# Patient Record
Sex: Female | Born: 1985 | Race: White | Hispanic: No | Marital: Married | Smoking: Current every day smoker
Health system: Southern US, Community
[De-identification: ages and names within clinical notes are randomized; demographics above are authoritative.]

## PROBLEM LIST (undated history)

## (undated) DIAGNOSIS — I10 Essential (primary) hypertension: Secondary | ICD-10-CM

---

## 2007-06-07 ENCOUNTER — Emergency Department: Payer: Self-pay | Admitting: Emergency Medicine

## 2011-10-05 ENCOUNTER — Encounter: Payer: Self-pay | Admitting: Obstetrics and Gynecology

## 2011-12-28 ENCOUNTER — Observation Stay: Payer: Self-pay | Admitting: Obstetrics and Gynecology

## 2011-12-28 LAB — CBC WITH DIFFERENTIAL/PLATELET
Basophil #: 0 10*3/uL (ref 0.0–0.1)
Eosinophil #: 0.2 10*3/uL (ref 0.0–0.7)
Eosinophil %: 1.6 %
HCT: 32.4 % — ABNORMAL LOW (ref 35.0–47.0)
Lymphocyte %: 14.5 %
MCHC: 33.9 g/dL (ref 32.0–36.0)
Monocyte %: 5.4 %
Neutrophil #: 9.2 10*3/uL — ABNORMAL HIGH (ref 1.4–6.5)
Neutrophil %: 78.3 %
Platelet: 154 10*3/uL (ref 150–440)
RBC: 3.39 10*6/uL — ABNORMAL LOW (ref 3.80–5.20)
RDW: 13.4 % (ref 11.5–14.5)
WBC: 11.8 10*3/uL — ABNORMAL HIGH (ref 3.6–11.0)

## 2012-04-03 ENCOUNTER — Inpatient Hospital Stay: Payer: Self-pay | Admitting: Obstetrics and Gynecology

## 2012-04-03 LAB — CBC WITH DIFFERENTIAL/PLATELET
Basophil #: 0 10*3/uL (ref 0.0–0.1)
Basophil %: 0.2 %
HGB: 10.4 g/dL — ABNORMAL LOW (ref 12.0–16.0)
Lymphocyte #: 2 10*3/uL (ref 1.0–3.6)
Lymphocyte %: 15.2 %
MCHC: 34.7 g/dL (ref 32.0–36.0)
MCV: 92 fL (ref 80–100)
Monocyte #: 0.9 x10 3/mm (ref 0.2–0.9)
Neutrophil #: 10.2 10*3/uL — ABNORMAL HIGH (ref 1.4–6.5)
Neutrophil %: 76.3 %
RBC: 3.26 10*6/uL — ABNORMAL LOW (ref 3.80–5.20)
WBC: 13.3 10*3/uL — ABNORMAL HIGH (ref 3.6–11.0)

## 2012-04-03 LAB — PROTEIN / CREATININE RATIO, URINE
Creatinine, Urine: 32.3 mg/dL (ref 30.0–125.0)
Protein/Creat. Ratio: 217 mg/gCREAT — ABNORMAL HIGH (ref 0–200)

## 2012-04-05 LAB — HEMATOCRIT: HCT: 26.2 % — ABNORMAL LOW (ref 35.0–47.0)

## 2013-08-06 IMAGING — US US OB NUCHAL TRANSLUCENCY 1ST GEST - [facility] NRPT
1 series · 14 of 28 positions shown · non-contrast
Comparison: none

[Series 1: us ob nuchal translucency 1st gest - [facility] nrpt · 14 of 44 slices shown]
[im 2/44]
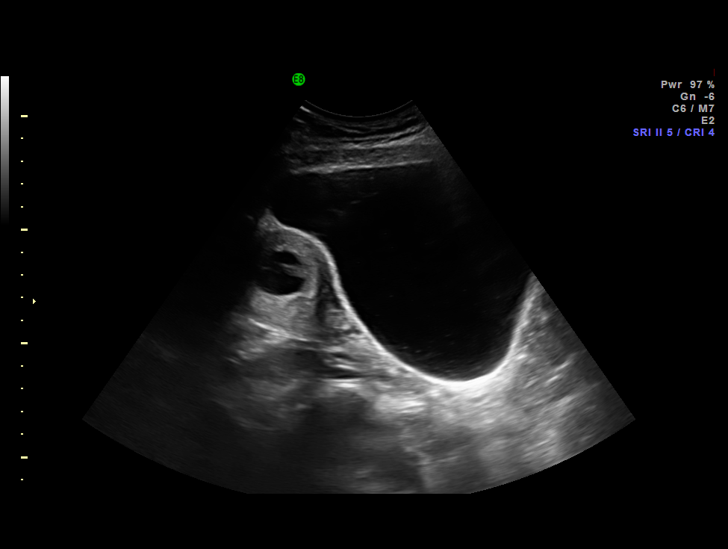
[im 5/44]
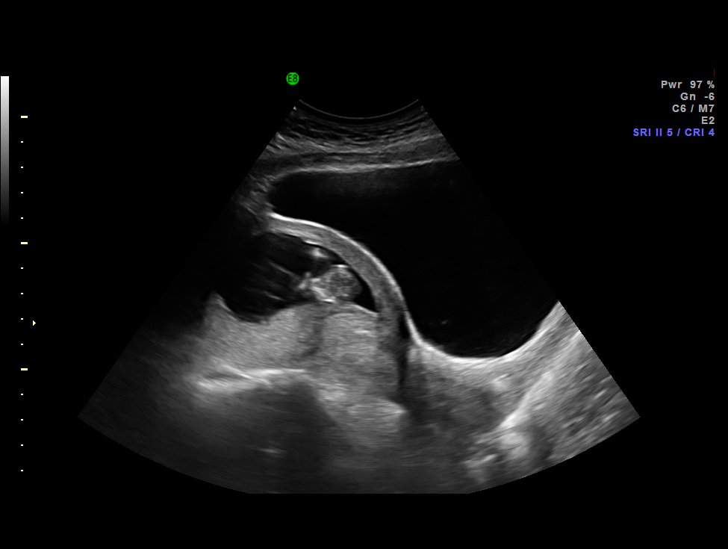
[im 8/44]
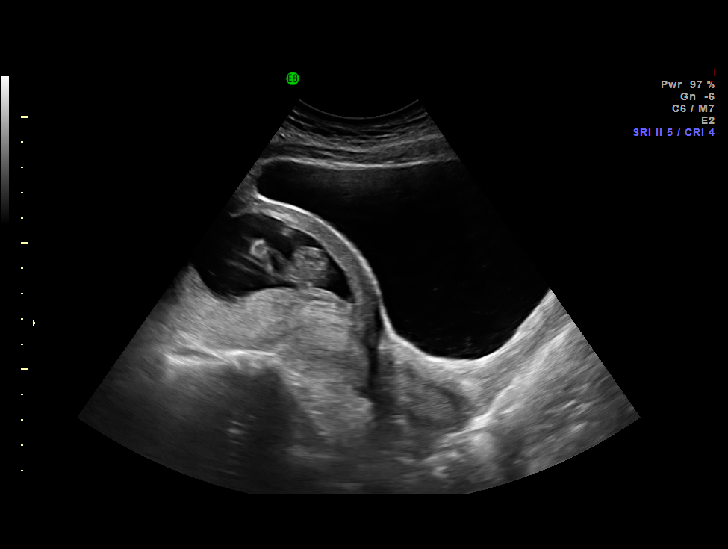
[im 12/44]
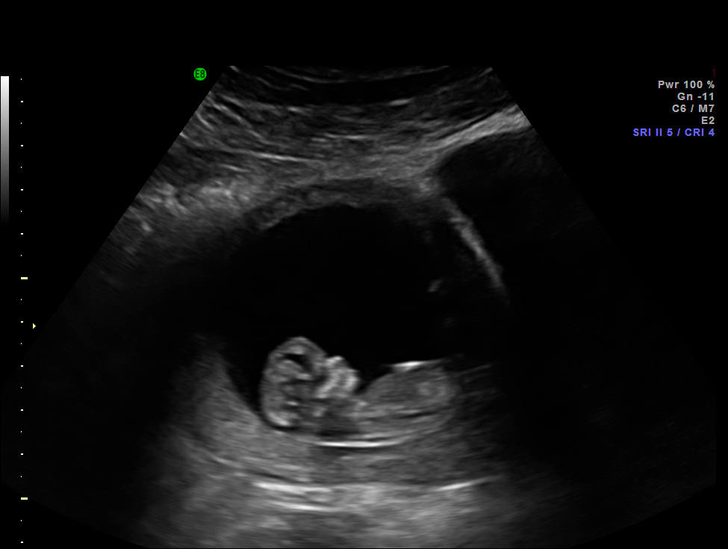
[im 15/44]
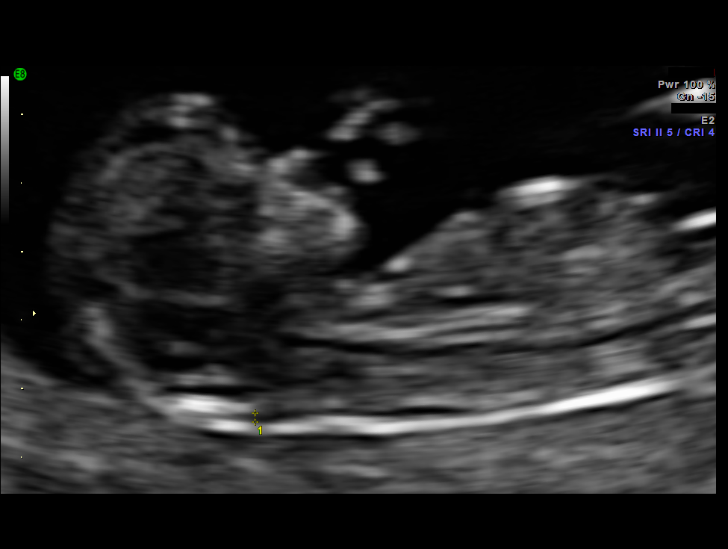
[im 18/44]
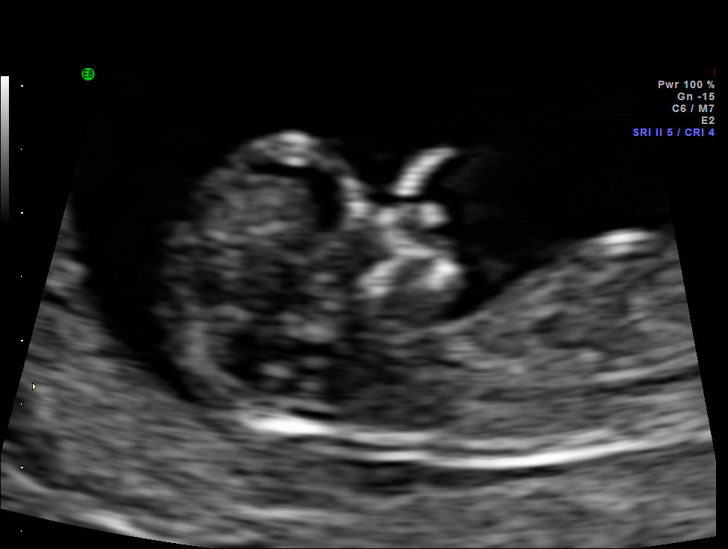
[im 21/44]
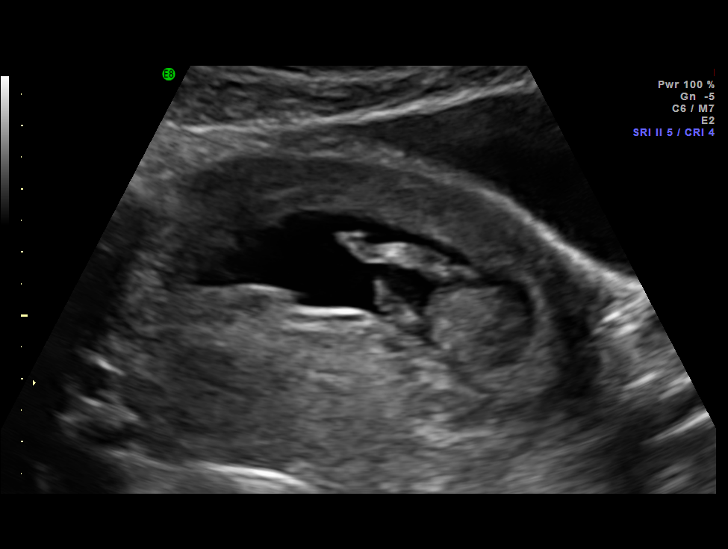
[im 24/44]
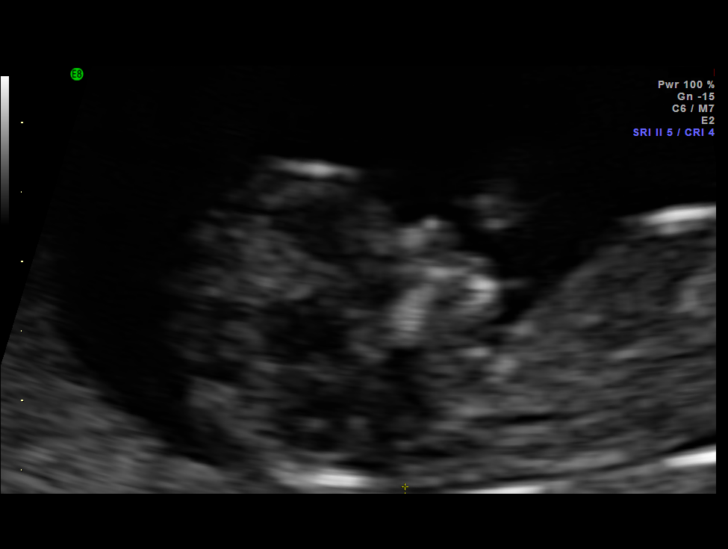
[im 28/44]
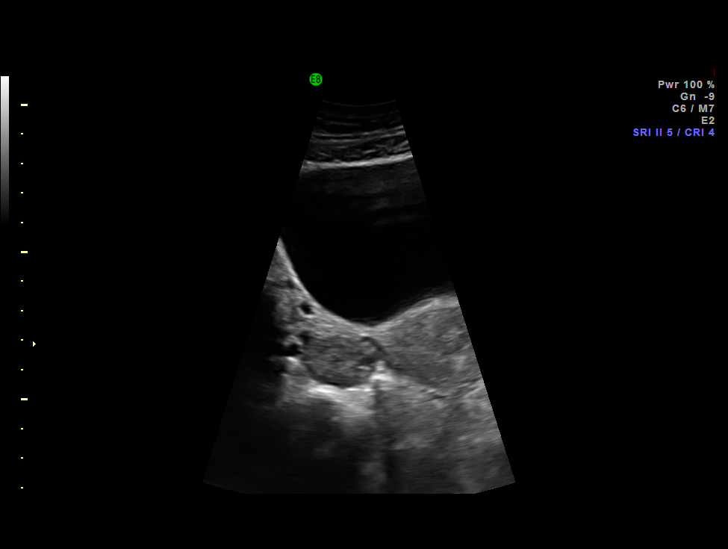
[im 31/44]
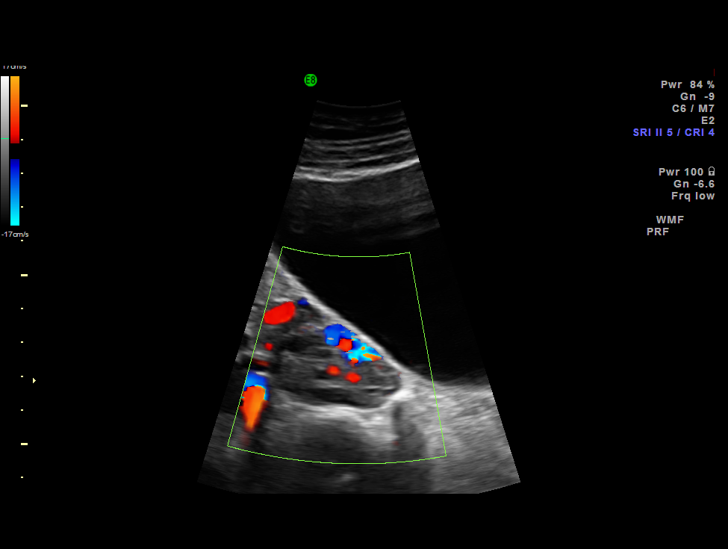
[im 34/44]
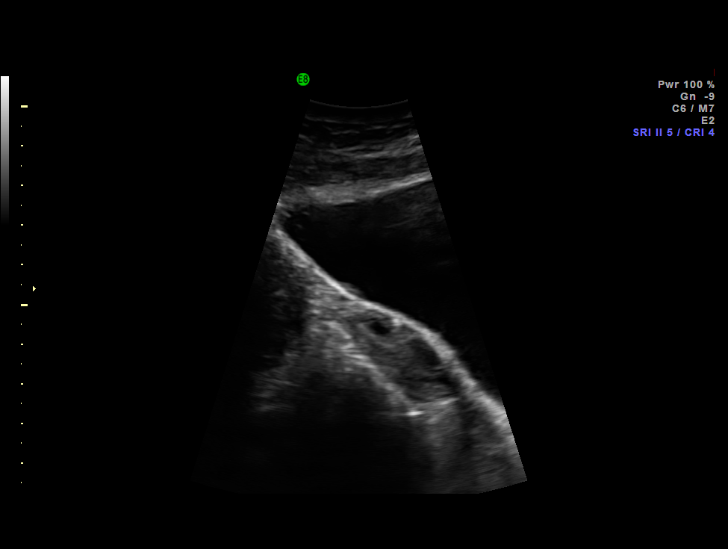
[im 37/44]
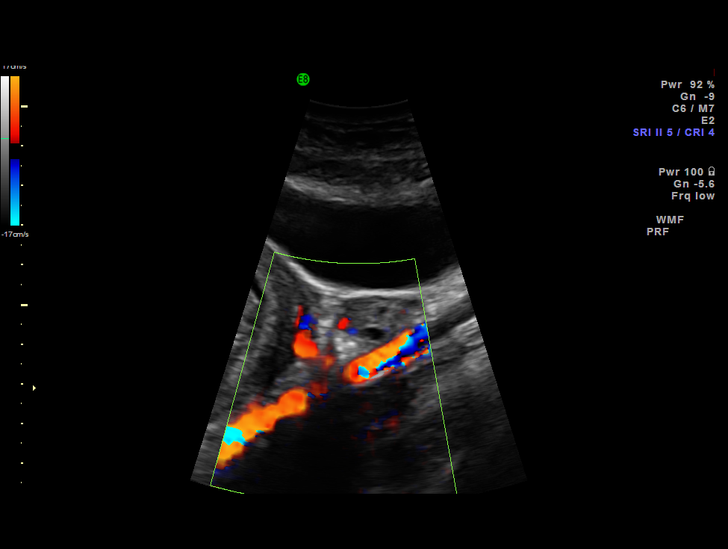
[im 40/44]
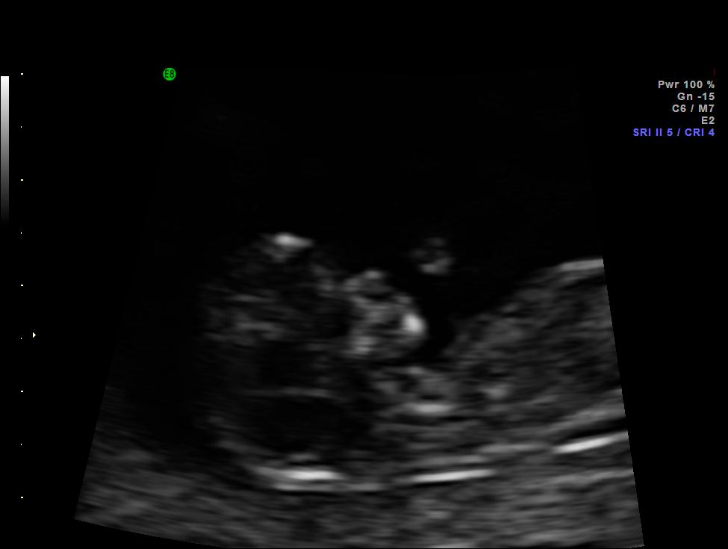
[im 44/44]
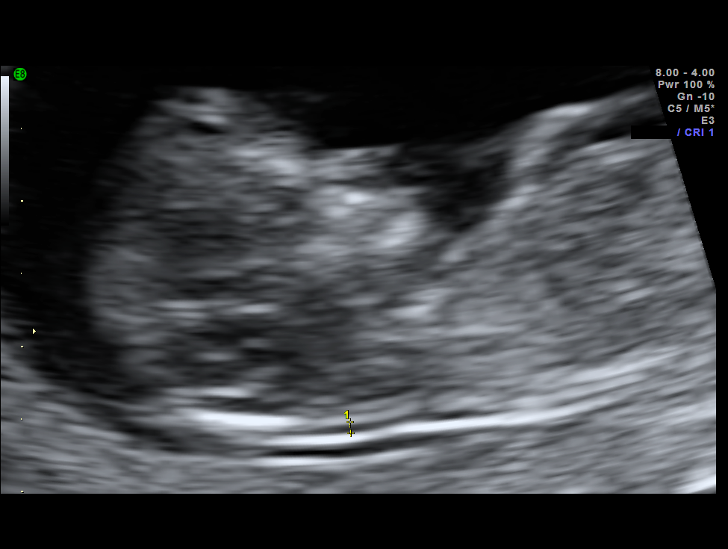

[14 of 28 positions shown; findings below may reference images not displayed]

IMAGES IMPORTED FROM THE [employer] WORKFLOW SYSTEM
NO DICTATION FOR STUDY

## 2015-04-13 NOTE — H&P (Signed)
L&D Evaluation:  History:   HPI 29yo G1P0 with LMP of 07/12/11 & EDD of 04/16/12 here tonight for oIOL at 38 1/7 weeks scheduled by Acquanetta BellingAngela Lugiano, CNM today due to BP elevation in the office. Labs and urine for protein or 24 h urine has not been done so will get labs tonight. Pt denies RUQ pain, visual disturbances, HA. Cx wasa 1/60/vtx -2 with Bishop score of 8. Cervidil planned and pt advised of the risks, benefits and alternatives of IOL and she and partner agree with plan. Dr Feliberto GottronSchermerhorn was advised that pt was 38 1/7 weeks and that her BP initial was 140/93 and now 128/85. He disc that Dr Logan BoresEVans and Dolores PattyAnglea Lugiano, CNM may have disc the plan of care today and set up the IOL. IOL orders written by Acquanetta BellingAngela Lugiano, CNM.    Presents with Trem preg with Gest HTN    Patient's Medical History Anemia, Gest HTN    Medications Pre Natal Vitamins    Family History Pt's mom had spinal bifida   ROS:   ROS All systems were reviewed.  HEENT, CNS, GI, GU, Respiratory, CV, Renal and Musculoskeletal systems were found to be normal.   Exam:   Vital Signs 128/85    General no apparent distress    Mental Status clear    Chest clear    Heart normal sinus rhythm, no murmur/gallop/rubs    Abdomen gravid, non-tender    Estimated Fetal Weight Average for gestational age    Back no CVAT    Edema 1+    Reflexes 1+    Clonus negative    Mebranes Intact    Ucx absent    Skin dry    Lymph no lymphadenopathy   Impression:   Impression IOL for Gest HTN   Plan:   Plan monitor contractions and for cervical change   Electronic Signatures: Sharee PimpleJones, Corinthian Kemler W (CNM)  (Signed 01-May-13 22:18)  Authored: L&D Evaluation   Last Updated: 01-May-13 22:18 by Sharee PimpleJones, Maddie Brazier W (CNM)

## 2019-11-03 ENCOUNTER — Other Ambulatory Visit: Payer: Self-pay

## 2019-11-03 DIAGNOSIS — Z20822 Contact with and (suspected) exposure to covid-19: Secondary | ICD-10-CM

## 2019-11-04 LAB — NOVEL CORONAVIRUS, NAA: SARS-CoV-2, NAA: NOT DETECTED

## 2020-07-16 ENCOUNTER — Other Ambulatory Visit (HOSPITAL_COMMUNITY): Payer: Self-pay | Admitting: Radiology

## 2020-07-16 ENCOUNTER — Other Ambulatory Visit: Payer: Self-pay | Admitting: Student

## 2020-07-16 ENCOUNTER — Other Ambulatory Visit (HOSPITAL_COMMUNITY): Payer: Self-pay | Admitting: Student

## 2020-07-16 DIAGNOSIS — F1090 Alcohol use, unspecified, uncomplicated: Secondary | ICD-10-CM

## 2020-07-16 DIAGNOSIS — R748 Abnormal levels of other serum enzymes: Secondary | ICD-10-CM

## 2020-07-16 DIAGNOSIS — Z7289 Other problems related to lifestyle: Secondary | ICD-10-CM

## 2020-07-20 ENCOUNTER — Ambulatory Visit: Payer: BLUE CROSS/BLUE SHIELD

## 2022-10-14 ENCOUNTER — Encounter
Admission: EM | Disposition: A | Discharge status: Home / Self Care | Payer: Self-pay | Source: Home / Self Care | Attending: Emergency Medicine

## 2022-10-14 ENCOUNTER — Encounter: Payer: Self-pay | Admitting: Emergency Medicine

## 2022-10-14 ENCOUNTER — Other Ambulatory Visit: Payer: Self-pay

## 2022-10-14 ENCOUNTER — Inpatient Hospital Stay: Payer: BLUE CROSS/BLUE SHIELD | Admitting: Certified Registered Nurse Anesthetist

## 2022-10-14 ENCOUNTER — Emergency Department: Payer: BLUE CROSS/BLUE SHIELD

## 2022-10-14 ENCOUNTER — Observation Stay
Admission: EM | Admit: 2022-10-14 | Discharge: 2022-10-15 | Disposition: A | Discharge status: Home / Self Care | Payer: BLUE CROSS/BLUE SHIELD | Attending: Internal Medicine | Admitting: Internal Medicine

## 2022-10-14 DIAGNOSIS — Z1152 Encounter for screening for COVID-19: Secondary | ICD-10-CM | POA: Diagnosis not present

## 2022-10-14 DIAGNOSIS — Z833 Family history of diabetes mellitus: Secondary | ICD-10-CM

## 2022-10-14 DIAGNOSIS — D62 Acute posthemorrhagic anemia: Secondary | ICD-10-CM | POA: Diagnosis present

## 2022-10-14 DIAGNOSIS — E872 Acidosis, unspecified: Secondary | ICD-10-CM | POA: Diagnosis present

## 2022-10-14 DIAGNOSIS — F10930 Alcohol use, unspecified with withdrawal, uncomplicated: Secondary | ICD-10-CM

## 2022-10-14 DIAGNOSIS — K2971 Gastritis, unspecified, with bleeding: Secondary | ICD-10-CM | POA: Diagnosis present

## 2022-10-14 DIAGNOSIS — K209 Esophagitis, unspecified without bleeding: Secondary | ICD-10-CM | POA: Diagnosis not present

## 2022-10-14 DIAGNOSIS — K254 Chronic or unspecified gastric ulcer with hemorrhage: Principal | ICD-10-CM | POA: Insufficient documentation

## 2022-10-14 DIAGNOSIS — E876 Hypokalemia: Secondary | ICD-10-CM | POA: Diagnosis not present

## 2022-10-14 DIAGNOSIS — Z818 Family history of other mental and behavioral disorders: Secondary | ICD-10-CM

## 2022-10-14 DIAGNOSIS — K2980 Duodenitis without bleeding: Secondary | ICD-10-CM | POA: Insufficient documentation

## 2022-10-14 DIAGNOSIS — K922 Gastrointestinal hemorrhage, unspecified: Secondary | ICD-10-CM | POA: Diagnosis present

## 2022-10-14 DIAGNOSIS — D696 Thrombocytopenia, unspecified: Secondary | ICD-10-CM | POA: Diagnosis present

## 2022-10-14 DIAGNOSIS — F17210 Nicotine dependence, cigarettes, uncomplicated: Secondary | ICD-10-CM | POA: Insufficient documentation

## 2022-10-14 DIAGNOSIS — Z20822 Contact with and (suspected) exposure to covid-19: Secondary | ICD-10-CM | POA: Diagnosis present

## 2022-10-14 DIAGNOSIS — K319 Disease of stomach and duodenum, unspecified: Secondary | ICD-10-CM | POA: Diagnosis present

## 2022-10-14 DIAGNOSIS — F1010 Alcohol abuse, uncomplicated: Secondary | ICD-10-CM | POA: Insufficient documentation

## 2022-10-14 DIAGNOSIS — K828 Other specified diseases of gallbladder: Secondary | ICD-10-CM | POA: Diagnosis present

## 2022-10-14 DIAGNOSIS — R7401 Elevation of levels of liver transaminase levels: Secondary | ICD-10-CM | POA: Insufficient documentation

## 2022-10-14 DIAGNOSIS — I10 Essential (primary) hypertension: Secondary | ICD-10-CM | POA: Diagnosis not present

## 2022-10-14 DIAGNOSIS — D539 Nutritional anemia, unspecified: Secondary | ICD-10-CM | POA: Insufficient documentation

## 2022-10-14 DIAGNOSIS — K8020 Calculus of gallbladder without cholecystitis without obstruction: Secondary | ICD-10-CM | POA: Diagnosis not present

## 2022-10-14 DIAGNOSIS — Z791 Long term (current) use of non-steroidal anti-inflammatories (NSAID): Secondary | ICD-10-CM | POA: Diagnosis not present

## 2022-10-14 DIAGNOSIS — K921 Melena: Secondary | ICD-10-CM | POA: Diagnosis not present

## 2022-10-14 DIAGNOSIS — K7011 Alcoholic hepatitis with ascites: Secondary | ICD-10-CM | POA: Insufficient documentation

## 2022-10-14 DIAGNOSIS — K8000 Calculus of gallbladder with acute cholecystitis without obstruction: Secondary | ICD-10-CM | POA: Diagnosis present

## 2022-10-14 DIAGNOSIS — K259 Gastric ulcer, unspecified as acute or chronic, without hemorrhage or perforation: Secondary | ICD-10-CM | POA: Insufficient documentation

## 2022-10-14 DIAGNOSIS — K269 Duodenal ulcer, unspecified as acute or chronic, without hemorrhage or perforation: Secondary | ICD-10-CM | POA: Diagnosis not present

## 2022-10-14 HISTORY — PX: ESOPHAGOGASTRODUODENOSCOPY (EGD) WITH PROPOFOL: SHX5813

## 2022-10-14 HISTORY — DX: Essential (primary) hypertension: I10

## 2022-10-14 LAB — CBC
HCT: 26.7 % — ABNORMAL LOW (ref 36.0–46.0)
Hemoglobin: 8.7 g/dL — ABNORMAL LOW (ref 12.0–15.0)
MCH: 38.7 pg — ABNORMAL HIGH (ref 26.0–34.0)
MCHC: 32.6 g/dL (ref 30.0–36.0)
MCV: 118.7 fL — ABNORMAL HIGH (ref 80.0–100.0)
Platelets: 130 10*3/uL — ABNORMAL LOW (ref 150–400)
RBC: 2.25 MIL/uL — ABNORMAL LOW (ref 3.87–5.11)
RDW: 13.4 % (ref 11.5–15.5)
WBC: 14.7 10*3/uL — ABNORMAL HIGH (ref 4.0–10.5)
nRBC: 0.1 % (ref 0.0–0.2)

## 2022-10-14 LAB — LIPASE, BLOOD: Lipase: 148 U/L — ABNORMAL HIGH (ref 11–51)

## 2022-10-14 LAB — URINALYSIS, ROUTINE W REFLEX MICROSCOPIC
Bilirubin Urine: NEGATIVE
Glucose, UA: NEGATIVE mg/dL
Hgb urine dipstick: NEGATIVE
Ketones, ur: 5 mg/dL — AB
Leukocytes,Ua: NEGATIVE
Nitrite: NEGATIVE
Protein, ur: NEGATIVE mg/dL
Specific Gravity, Urine: 1.02 (ref 1.005–1.030)
pH: 5 (ref 5.0–8.0)

## 2022-10-14 LAB — COMPREHENSIVE METABOLIC PANEL
ALT: 22 U/L (ref 0–44)
AST: 53 U/L — ABNORMAL HIGH (ref 15–41)
Albumin: 3 g/dL — ABNORMAL LOW (ref 3.5–5.0)
Alkaline Phosphatase: 145 U/L — ABNORMAL HIGH (ref 38–126)
Anion gap: 11 (ref 5–15)
BUN: 25 mg/dL — ABNORMAL HIGH (ref 6–20)
CO2: 29 mmol/L (ref 22–32)
Calcium: 8.4 mg/dL — ABNORMAL LOW (ref 8.9–10.3)
Chloride: 97 mmol/L — ABNORMAL LOW (ref 98–111)
Creatinine, Ser: 0.61 mg/dL (ref 0.44–1.00)
GFR, Estimated: >60 mL/min (ref >60)
Glucose, Bld: 170 mg/dL — ABNORMAL HIGH (ref 70–99)
Potassium: 3.1 mmol/L — ABNORMAL LOW (ref 3.5–5.1)
Sodium: 137 mmol/L (ref 135–145)
Total Bilirubin: 2.4 mg/dL — ABNORMAL HIGH (ref 0.3–1.2)
Total Protein: 6.5 g/dL (ref 6.5–8.1)

## 2022-10-14 LAB — PROTIME-INR
INR: 1.5 — ABNORMAL HIGH (ref 0.8–1.2)
Prothrombin Time: 17.9 seconds — ABNORMAL HIGH (ref 11.4–15.2)

## 2022-10-14 LAB — AMMONIA: Ammonia: 36 umol/L — ABNORMAL HIGH (ref 9–35)

## 2022-10-14 LAB — PREGNANCY, URINE: Preg Test, Ur: NEGATIVE

## 2022-10-14 LAB — IRON AND TIBC
Iron: 119 ug/dL (ref 28–170)
Saturation Ratios: 53 % — ABNORMAL HIGH (ref 10.4–31.8)
TIBC: 225 ug/dL — ABNORMAL LOW (ref 250–450)
UIBC: 106 ug/dL

## 2022-10-14 LAB — ABO/RH: ABO/RH(D): A POS

## 2022-10-14 LAB — TSH: TSH: 2.396 u[IU]/mL (ref 0.350–4.500)

## 2022-10-14 LAB — RESP PANEL BY RT-PCR (FLU A&B, COVID) ARPGX2
Influenza A by PCR: NEGATIVE
Influenza B by PCR: NEGATIVE
SARS Coronavirus 2 by RT PCR: NEGATIVE

## 2022-10-14 LAB — FERRITIN: Ferritin: 389 ng/mL — ABNORMAL HIGH (ref 11–307)

## 2022-10-14 LAB — VITAMIN B12: Vitamin B-12: 221 pg/mL (ref 180–914)

## 2022-10-14 LAB — LACTIC ACID, PLASMA: Lactic Acid, Venous: 2.3 mmol/L (ref 0.5–1.9)

## 2022-10-14 LAB — TROPONIN I (HIGH SENSITIVITY)
Troponin I (High Sensitivity): 42 ng/L — ABNORMAL HIGH (ref <18)
Troponin I (High Sensitivity): 43 ng/L — ABNORMAL HIGH (ref <18)

## 2022-10-14 SURGERY — ESOPHAGOGASTRODUODENOSCOPY (EGD) WITH PROPOFOL
Anesthesia: General

## 2022-10-14 MED ORDER — ONDANSETRON HCL 4 MG/2ML IJ SOLN
INTRAMUSCULAR | Status: AC
  Filled 2022-10-14: qty 2

## 2022-10-14 MED ORDER — ONDANSETRON HCL 4 MG/2ML IJ SOLN: 4.0000 mg | Freq: Four times a day (QID) | INTRAMUSCULAR | Status: DC | PRN

## 2022-10-14 MED ORDER — SODIUM CHLORIDE 0.9 % IV SOLN
2.0000 g | Freq: Once | INTRAVENOUS | Status: AC
  Administered 2022-10-14: 2 g via INTRAVENOUS
  Filled 2022-10-14: qty 20

## 2022-10-14 MED ORDER — OCTREOTIDE LOAD VIA INFUSION
50.0000 ug | Freq: Once | INTRAVENOUS | Status: AC
  Administered 2022-10-14: 50 ug via INTRAVENOUS
  Filled 2022-10-14: qty 25

## 2022-10-14 MED ORDER — POTASSIUM CHLORIDE 10 MEQ/100ML IV SOLN
10.0000 meq | INTRAVENOUS | Status: AC
  Administered 2022-10-14 (×4): 10 meq via INTRAVENOUS
  Filled 2022-10-14 (×2): qty 100

## 2022-10-14 MED ORDER — LORAZEPAM 2 MG/ML IJ SOLN: 1.0000 mg | Freq: Once | INTRAMUSCULAR | Status: DC

## 2022-10-14 MED ORDER — SODIUM CHLORIDE 0.9 % IV BOLUS
1000.0000 mL | Freq: Once | INTRAVENOUS | Status: AC
  Administered 2022-10-14: 1000 mL via INTRAVENOUS

## 2022-10-14 MED ORDER — LORAZEPAM 2 MG/ML IJ SOLN: 0.0000 mg | Freq: Four times a day (QID) | INTRAMUSCULAR | Status: DC

## 2022-10-14 MED ORDER — DEXTROSE-NACL 5-0.45 % IV SOLN: INTRAVENOUS | Status: DC

## 2022-10-14 MED ORDER — ONDANSETRON HCL 4 MG/2ML IJ SOLN
INTRAMUSCULAR | Status: DC | PRN
  Administered 2022-10-14: 4 mg via INTRAVENOUS

## 2022-10-14 MED ORDER — LORAZEPAM 2 MG PO TABS: 0.0000 mg | ORAL_TABLET | Freq: Two times a day (BID) | ORAL | Status: DC

## 2022-10-14 MED ORDER — PANTOPRAZOLE SODIUM 40 MG IV SOLR: 40.0000 mg | Freq: Two times a day (BID) | INTRAVENOUS | Status: DC

## 2022-10-14 MED ORDER — LORAZEPAM 2 MG/ML IJ SOLN: 0.0000 mg | Freq: Two times a day (BID) | INTRAMUSCULAR | Status: DC

## 2022-10-14 MED ORDER — THIAMINE HCL 100 MG/ML IJ SOLN
100.0000 mg | Freq: Every day | INTRAMUSCULAR | Status: DC
  Administered 2022-10-14: 100 mg via INTRAVENOUS
  Filled 2022-10-14: qty 2

## 2022-10-14 MED ORDER — THIAMINE HCL 100 MG/ML IJ SOLN: 100.0000 mg | Freq: Every day | INTRAMUSCULAR | Status: DC

## 2022-10-14 MED ORDER — DM-GUAIFENESIN ER 30-600 MG PO TB12: 1.0000 | ORAL_TABLET | Freq: Two times a day (BID) | ORAL | Status: DC | PRN

## 2022-10-14 MED ORDER — SUCCINYLCHOLINE CHLORIDE 200 MG/10ML IV SOSY
PREFILLED_SYRINGE | INTRAVENOUS | Status: DC | PRN
  Administered 2022-10-14: 100 mg via INTRAVENOUS

## 2022-10-14 MED ORDER — THIAMINE MONONITRATE 100 MG PO TABS
100.0000 mg | ORAL_TABLET | Freq: Every day | ORAL | Status: DC
  Administered 2022-10-15: 100 mg via ORAL
  Filled 2022-10-14 (×2): qty 1

## 2022-10-14 MED ORDER — SODIUM CHLORIDE 0.9 % IV SOLN: INTRAVENOUS | Status: DC

## 2022-10-14 MED ORDER — LORAZEPAM 2 MG/ML IJ SOLN: 1.0000 mg | INTRAMUSCULAR | Status: DC | PRN

## 2022-10-14 MED ORDER — LORAZEPAM 1 MG PO TABS: 1.0000 mg | ORAL_TABLET | ORAL | Status: DC | PRN

## 2022-10-14 MED ORDER — PANTOPRAZOLE INFUSION (NEW) - SIMPLE MED
8.0000 mg/h | INTRAVENOUS | Status: DC
  Administered 2022-10-14: 8 mg/h via INTRAVENOUS
  Filled 2022-10-14: qty 100

## 2022-10-14 MED ORDER — PANTOPRAZOLE 80MG IVPB - SIMPLE MED
80.0000 mg | Freq: Once | INTRAVENOUS | Status: AC
  Administered 2022-10-14: 80 mg via INTRAVENOUS
  Filled 2022-10-14: qty 100

## 2022-10-14 MED ORDER — IPRATROPIUM-ALBUTEROL 0.5-2.5 (3) MG/3ML IN SOLN: 3.0000 mL | RESPIRATORY_TRACT | Status: DC | PRN

## 2022-10-14 MED ORDER — SODIUM CHLORIDE 0.9 % IV SOLN
50.0000 ug/h | INTRAVENOUS | Status: DC
  Administered 2022-10-14: 50 ug/h via INTRAVENOUS
  Filled 2022-10-14 (×2): qty 1

## 2022-10-14 MED ORDER — FOLIC ACID 1 MG PO TABS
1.0000 mg | ORAL_TABLET | Freq: Every day | ORAL | Status: DC
  Administered 2022-10-15: 1 mg via ORAL
  Filled 2022-10-14: qty 1

## 2022-10-14 MED ORDER — FENTANYL CITRATE (PF) 100 MCG/2ML IJ SOLN
INTRAMUSCULAR | Status: DC | PRN
  Administered 2022-10-14: 100 ug via INTRAVENOUS

## 2022-10-14 MED ORDER — PROPOFOL 10 MG/ML IV BOLUS
INTRAVENOUS | Status: DC | PRN
  Administered 2022-10-14: 110 mg via INTRAVENOUS

## 2022-10-14 MED ORDER — LIDOCAINE HCL (CARDIAC) PF 100 MG/5ML IV SOSY
PREFILLED_SYRINGE | INTRAVENOUS | Status: DC | PRN
  Administered 2022-10-14: 60 mg via INTRAVENOUS

## 2022-10-14 MED ORDER — LORAZEPAM 2 MG/ML IJ SOLN
1.0000 mg | Freq: Once | INTRAMUSCULAR | Status: AC
  Administered 2022-10-14: 1 mg via INTRAVENOUS
  Filled 2022-10-14: qty 1

## 2022-10-14 MED ORDER — HYDRALAZINE HCL 20 MG/ML IJ SOLN: 10.0000 mg | INTRAMUSCULAR | Status: DC | PRN

## 2022-10-14 MED ORDER — METOPROLOL TARTRATE 5 MG/5ML IV SOLN: 5.0000 mg | INTRAVENOUS | Status: DC | PRN

## 2022-10-14 MED ORDER — LORAZEPAM 2 MG PO TABS: 0.0000 mg | ORAL_TABLET | Freq: Four times a day (QID) | ORAL | Status: DC

## 2022-10-14 MED ORDER — THIAMINE MONONITRATE 100 MG PO TABS: 100.0000 mg | ORAL_TABLET | Freq: Every day | ORAL | Status: DC

## 2022-10-14 MED ORDER — PANTOPRAZOLE SODIUM 40 MG IV SOLR
40.0000 mg | Freq: Two times a day (BID) | INTRAVENOUS | Status: DC
  Administered 2022-10-14 – 2022-10-15 (×2): 40 mg via INTRAVENOUS
  Filled 2022-10-14 (×2): qty 10

## 2022-10-14 MED ORDER — DEXAMETHASONE SODIUM PHOSPHATE 10 MG/ML IJ SOLN
INTRAMUSCULAR | Status: DC | PRN
  Administered 2022-10-14: 10 mg via INTRAVENOUS

## 2022-10-14 MED ORDER — TRAZODONE HCL 50 MG PO TABS: 50.0000 mg | ORAL_TABLET | Freq: Every evening | ORAL | Status: DC | PRN

## 2022-10-14 MED ORDER — SENNOSIDES-DOCUSATE SODIUM 8.6-50 MG PO TABS: 1.0000 | ORAL_TABLET | Freq: Every evening | ORAL | Status: DC | PRN

## 2022-10-14 MED ORDER — FENTANYL CITRATE (PF) 100 MCG/2ML IJ SOLN
INTRAMUSCULAR | Status: AC
  Filled 2022-10-14: qty 2

## 2022-10-14 MED ORDER — ADULT MULTIVITAMIN W/MINERALS CH
1.0000 | ORAL_TABLET | Freq: Every day | ORAL | Status: DC
  Administered 2022-10-15: 1 via ORAL
  Filled 2022-10-14: qty 1

## 2022-10-14 MED ORDER — DEXAMETHASONE SODIUM PHOSPHATE 10 MG/ML IJ SOLN
INTRAMUSCULAR | Status: AC
  Filled 2022-10-14: qty 1

## 2022-10-14 NOTE — ED Notes (Addendum)
To ENDO now via stretcher. 1st unit of PRBCs spiked and ready to go, to be started in ENDO per Dr. Timothy Lasso. Transfusion consent signed with pt.  Pt has bed assignment 260.

## 2022-10-14 NOTE — H&P (View-Only) (Signed)
Inpatient Consultation   Patient ID: Andrea Franco is a 36 y.o. female.  Requesting Provider: Marjean Donna, MD  Date of Admission: 10/14/2022  Date of Consult: 10/14/22   Reason for Consultation: etoh abuse, melena, anemia   Patient's Chief Complaint:   Chief Complaint  Patient presents with   Abdominal Pain   Diarrhea    36 year old Caucasian female with history of alcohol abuse who presents in evaluation for dark stools, nausea vomiting.  Gastroenterology is consulted for potential GI bleeding.  She is accompanied by her husband at bedside who helps provide history  Patient has not been feeling well for the last 2 weeks with increased abdominal bloating.  She denies any issues in terms of swelling her inability to close her pants.  She has been drinking a bottle of wine daily for at least 7 years and drank other products prior to that.  She smokes half to 1 full pack of cigarettes per day.  No other drug use.  She has no other known liver disease.  She denies any hematemesis.  No abdominal pain fevers or chills.  Denies Pepto and iron use.  Hemoglobin on presentation is 8.7 and grossly macrocytic at 118.  Platelets 130,000 white count 14.7.  Her BUN/creatinine ratio is 41.  AST 53 ALT 22 total bili 2.4 alk phos 45  800 mg ibuprofen daily over the last several days Denies Anti-plt agents, and anticoagulants Denies family history of gastrointestinal disease and malignancy Previous Endoscopies: None     Past Medical History:  Diagnosis Date   Hypertension     History reviewed. No pertinent surgical history.  No family history of GI disease or malignancy     Pertinent GI related history and allergies were reviewed with the patient  Review of Systems  Constitutional:  Negative for activity change, appetite change, chills, diaphoresis, fatigue, fever and unexpected weight change.  HENT:  Negative for trouble swallowing and voice change.   Respiratory:  Negative for  shortness of breath and wheezing.   Cardiovascular:  Negative for chest pain, palpitations and leg swelling.  Gastrointestinal:  Positive for abdominal distention, blood in stool (dark stools/melena), diarrhea, nausea and vomiting. Negative for abdominal pain, anal bleeding, constipation and rectal pain.  Musculoskeletal:  Negative for arthralgias and myalgias.  Skin:  Negative for color change and pallor.  Neurological:  Negative for dizziness, syncope and weakness.  Psychiatric/Behavioral:  Negative for confusion.   All other systems reviewed and are negative.    Medications Home Medications No current facility-administered medications on file prior to encounter.   No current outpatient medications on file prior to encounter.   Pertinent GI related medications were reviewed with the patient  Inpatient Medications  Current Facility-Administered Medications:    0.9 %  sodium chloride infusion, , Intravenous, Continuous, Amin, Ankit Chirag, MD, Last Rate: 75 mL/hr at 10/14/22 1220, New Bag at 10/14/22 1220   dextromethorphan-guaiFENesin (MUCINEX DM) 30-600 MG per 12 hr tablet 1 tablet, 1 tablet, Oral, BID PRN, Amin, Ankit Chirag, MD   dextrose 5 %-0.45 % sodium chloride infusion, , Intravenous, Continuous, Amin, Ankit Chirag, MD   folic acid (FOLVITE) tablet 1 mg, 1 mg, Oral, Daily, Amin, Ankit Chirag, MD   hydrALAZINE (APRESOLINE) injection 10 mg, 10 mg, Intravenous, Q4H PRN, Amin, Ankit Chirag, MD   ipratropium-albuterol (DUONEB) 0.5-2.5 (3) MG/3ML nebulizer solution 3 mL, 3 mL, Nebulization, Q4H PRN, Amin, Ankit Chirag, MD   LORazepam (ATIVAN) injection 0-4 mg, 0-4 mg, Intravenous, Q6H **OR**  LORazepam (ATIVAN) tablet 0-4 mg, 0-4 mg, Oral, Q6H, Vanessa [place], MD   [START ON 10/16/2022] LORazepam (ATIVAN) injection 0-4 mg, 0-4 mg, Intravenous, Q12H **OR** [START ON 10/16/2022] LORazepam (ATIVAN) tablet 0-4 mg, 0-4 mg, Oral, Q12H, [place]ke, Royetta Crochet, MD   LORazepam (ATIVAN) injection 1 mg, 1  mg, Intravenous, Once, Vanessa [place], MD   LORazepam (ATIVAN) tablet 1-4 mg, 1-4 mg, Oral, Q1H PRN **OR** LORazepam (ATIVAN) injection 1-4 mg, 1-4 mg, Intravenous, Q1H PRN, Amin, Ankit Chirag, MD   metoprolol tartrate (LOPRESSOR) injection 5 mg, 5 mg, Intravenous, Q4H PRN, Amin, Ankit Chirag, MD   multivitamin with minerals tablet 1 tablet, 1 tablet, Oral, Daily, Amin, Ankit Chirag, MD   octreotide (SANDOSTATIN) 2 mcg/mL load via infusion 50 mcg, 50 mcg, Intravenous, Once **AND** octreotide (SANDOSTATIN) 500 mcg in sodium chloride 0.9 % 250 mL (2 mcg/mL) infusion, 50 mcg/hr, Intravenous, Continuous, Vanessa Slayden, MD, Last Rate: 25 mL/hr at 10/14/22 1156, 50 mcg/hr at 10/14/22 1156   ondansetron (ZOFRAN) injection 4 mg, 4 mg, Intravenous, Q6H PRN, Amin, Jeanella Flattery, MD   [START ON 10/17/2022] pantoprazole (PROTONIX) injection 40 mg, 40 mg, Intravenous, Q12H, Vanessa Page, MD   pantoprozole (PROTONIX) 80 mg /NS 100 mL infusion, 8 mg/hr, Intravenous, Continuous, Vanessa [place], MD, Last Rate: 10 mL/hr at 10/14/22 1149, 8 mg/hr at 10/14/22 1149   potassium chloride 10 mEq in 100 mL IVPB, 10 mEq, Intravenous, Q1 Hr x 6, Amin, Ankit Chirag, MD   senna-docusate (Senokot-S) tablet 1 tablet, 1 tablet, Oral, QHS PRN, Amin, Ankit Chirag, MD   thiamine (VITAMIN B1) tablet 100 mg, 100 mg, Oral, Daily **OR** thiamine (VITAMIN B1) injection 100 mg, 100 mg, Intravenous, Daily, Amin, Ankit Chirag, MD   traZODone (DESYREL) tablet 50 mg, 50 mg, Oral, QHS PRN, Amin, Ankit Chirag, MD No current outpatient medications on file.  sodium chloride 75 mL/hr at 10/14/22 1220   dextrose 5 % and 0.45% NaCl     octreotide (SANDOSTATIN) 500 mcg in sodium chloride 0.9 % 250 mL (2 mcg/mL) infusion 50 mcg/hr (10/14/22 1156)   pantoprazole 8 mg/hr (10/14/22 1149)   potassium chloride      dextromethorphan-guaiFENesin, hydrALAZINE, ipratropium-albuterol, LORazepam **OR** LORazepam, metoprolol tartrate, ondansetron (ZOFRAN) IV,  senna-docusate, traZODone   Objective   Vitals:   10/14/22 1115 10/14/22 1130 10/14/22 1135 10/14/22 1150  BP:  (!) 122/106  (!) 131/98  Pulse: (!) 121 (!) 118 (!) 112 (!) 106  Resp: _0 Temp:      TempSrc:      SpO2: 100% 100% 100% 100%  Weight:      Height:         Physical Exam Vitals and nursing note reviewed.  Constitutional:      General: She is not in acute distress.    Appearance: She is ill-appearing. She is not toxic-appearing or diaphoretic.  HENT:     Head: Normocephalic and atraumatic.     Nose: Nose normal.     Mouth/Throat:     Mouth: Mucous membranes are moist.     Pharynx: Oropharynx is clear.  Eyes:     General: No scleral icterus.    Extraocular Movements: Extraocular movements intact.  Cardiovascular:     Rate and Rhythm: Regular rhythm. Tachycardia present.     Heart sounds: Normal heart sounds. No murmur heard.    No friction rub. No gallop.  Pulmonary:     Effort: Pulmonary effort is normal. No respiratory distress.  Breath sounds: Normal breath sounds. No wheezing, rhonchi or rales.  Abdominal:     General: Abdomen is flat. Bowel sounds are normal. There is no distension.     Palpations: Abdomen is soft.     Tenderness: There is no abdominal tenderness. There is no guarding or rebound.  Musculoskeletal:     Cervical back: Neck supple.     Right lower leg: No edema.     Left lower leg: No edema.  Skin:    General: Skin is warm and dry.     Coloration: Skin is jaundiced (mild) and pale.     Comments: Telangiectasias on the face and chest.  Slightly jaundiced appearing  Neurological:     General: No focal deficit present.     Mental Status: She is alert and oriented to person, place, and time. Mental status is at baseline.  Psychiatric:        Mood and Affect: Mood normal.        Behavior: Behavior normal.        Thought Content: Thought content normal.        Judgment: Judgment normal.     Laboratory Data Recent Labs   Lab 10/14/22 1005  WBC 14.7*  HGB 8.7*  HCT 26.7*  PLT 130*   Recent Labs  Lab 10/14/22 1005  NA 137  K 3.1*  CL 97*  CO2 29  BUN 25*  CALCIUM 8.4*  PROT 6.5  BILITOT 2.4*  ALKPHOS 145*  ALT 22  AST 53*  GLUCOSE 170*   Recent Labs  Lab 10/14/22 1119  INR 1.5*    Recent Labs    10/14/22 1005  LIPASE 148*        Imaging Studies: US ABDOMEN LIMITED RUQ (LIVER/GB)  Result Date: 10/14/2022 CLINICAL DATA:  Right upper quadrant pain for 2 weeks EXAM: ULTRASOUND ABDOMEN LIMITED RIGHT UPPER QUADRANT COMPARISON:  None Available. FINDINGS: Gallbladder: Gallstones. Mild gallbladder wall thickening. Minimal pericholecystic fluid. No sonographic Murphy sign noted by sonographer. Common bile duct: Diameter: 0.4 cm. Liver: No focal lesion identified. Increased parenchymal echogenicity. Portal vein is patent on color Doppler imaging with normal direction of blood flow towards the liver. Other: Small volume perihepatic ascites. IMPRESSION: 1. Cholelithiasis with mild gallbladder wall thickening and pericholecystic fluid, nonspecific in the setting of ascites. Negative sonographic Murphy sign. No biliary ductal dilatation. Findings are equivocal for acute cholecystitis. Consider further evaluation with HIDA scan if indicated by clinical concern for cholecystitis. 2. Hepatic steatosis. 3. Small volume perihepatic ascites. Electronically Signed   By: Delanna Ahmadi M.D.   On: 10/14/2022 11:51   DG Chest Portable 1 View  Result Date: 10/14/2022 CLINICAL DATA:  Abdominal bloating for 2 weeks EXAM: PORTABLE CHEST 1 VIEW COMPARISON:  None Available. FINDINGS: The heart size and mediastinal contours are within normal limits. No focal pulmonary opacity. No pleural effusion or pneumothorax. The visualized upper abdomen is unremarkable. No acute osseous abnormality. IMPRESSION: No acute cardiopulmonary abnormality. Electronically Signed   By: Beryle Flock M.D.   On: 10/14/2022 10:53     Assessment:   # Macrocytic anemia- suspect acute on chronic blood loss anemia in setting of melena - p/w hgb 8.7  # Alcoholic hepatitis; AST/ALT in 2:1 pattern (although only 53 and 22) - lipase elevated but <3xULN and no epigastric pain - hyperbilirubinemia - etiology of ascites - Mdf 23.6  # alcohol abuse - one bottle of wine daily for 7+ years  # Hypokalemia  # Coagulopathy- INR 1.5;  no current encephalopathy # thrombocytopenia- suspect anemia and dec plt in etoh bone marrow suppression setting  Plan:  Esophagogastroduodenoscopy planned for today pending patient stability and endoscopy suite availability NPO Labs reviewed Protonix 40 mg iv q12 h Hold dvt ppx Octreotide bolus and gtt for 72 hours total Ceftriaxone 1 g iv q24 for 7 days total for sbp ppx Monitor H&H.  Transfusion and resuscitation as per primary team Avoid frequent lab draws to prevent lab induced anemia Supportive care and antiemetics as per primary team Maintain two sites IV access Avoid nsaids Monitor for GIB.  CIWA protocol Counseled on etoh cessation [place] reviewed Request viral hepatitis panel Mdf <32- no indication for steroids  Further recommendations pending endoscopy. See op report for details.  Esophagogastroduodenoscopy with possible biopsy, control of bleeding, polypectomy, and interventions as necessary has been discussed with the patient/patient representative. Informed consent was obtained from the patient/patient representative after explaining the indication, nature, and risks of the procedure including but not limited to death, bleeding, perforation, missed neoplasm/lesions, cardiorespiratory compromise, and reaction to medications. Opportunity for questions was given and appropriate answers were provided. Patient/patient representative has verbalized understanding is amenable to undergoing the procedure.  I personally performed the service.  Management of other medical comorbidities  as per primary team  Thank you for allowing [place] to participate in this patient's care. Please don't hesitate to call if any questions or concerns arise.   Annamaria Helling, DO [facility]  Portions of the record may have been created with voice recognition software. Occasional wrong-word or 'sound-a-like' substitutions may have occurred due to the inherent limitations of voice recognition software.  Read the chart carefully and recognize, using context, where substitutions may have occurred.

## 2022-10-14 NOTE — Anesthesia Postprocedure Evaluation (Signed)
Anesthesia Post Note  Patient: Andrea Franco  Procedure(s) Performed: ESOPHAGOGASTRODUODENOSCOPY (EGD) WITH PROPOFOL  Patient location during evaluation: Endoscopy Anesthesia Type: General Level of consciousness: awake and alert Pain management: pain level controlled Vital Signs Assessment: post-procedure vital signs reviewed and stable Respiratory status: spontaneous breathing, nonlabored ventilation, respiratory function stable and patient connected to nasal cannula oxygen Cardiovascular status: blood pressure returned to baseline and stable Postop Assessment: no apparent nausea or vomiting Anesthetic complications: no   No notable events documented.   Last Vitals:  Vitals:   10/14/22 1519 10/14/22 1529  BP: (!) 130/98 (!) 129/98  Pulse:    Resp:    Temp:    SpO2:      Last Pain:  Vitals:   10/14/22 1529  TempSrc:   PainSc: 0-No pain                 Louie Boston

## 2022-10-14 NOTE — Interval H&P Note (Signed)
History and Physical Interval Note: Preprocedure H&P from 10/14/22  was reviewed and there was no interval change after seeing and examining the patient.  Written consent was obtained from the patient after discussion of risks, benefits, and alternatives. Patient has consented to proceed with Esophagogastroduodenoscopy with possible intervention   10/14/2022 12:32 PM  Andrea Franco  has presented today for surgery, with the diagnosis of melena, anemia.  The various methods of treatment have been discussed with the patient and family. After consideration of risks, benefits and other options for treatment, the patient has consented to  Procedure(s): ESOPHAGOGASTRODUODENOSCOPY (EGD) WITH PROPOFOL (N/A) as a surgical intervention.  The patient's history has been reviewed, patient examined, no change in status, stable for surgery.  I have reviewed the patient's chart and labs.  Questions were answered to the patient's satisfaction.     Jaynie Collins

## 2022-10-14 NOTE — Consult Note (Signed)
Inpatient Consultation   Patient ID: Andrea Franco is a 36 y.o. female.  Requesting Provider: Marjean Donna, MD  Date of Admission: 10/14/2022  Date of Consult: 10/14/22   Reason for Consultation: etoh abuse, melena, anemia   Patient's Chief Complaint:   Chief Complaint  Patient presents with   Abdominal Pain   Diarrhea    36 year old Caucasian female with history of alcohol abuse who presents in evaluation for dark stools, nausea vomiting.  Gastroenterology is consulted for potential GI bleeding.  She is accompanied by her husband at bedside who helps provide history  Patient has not been feeling well for the last 2 weeks with increased abdominal bloating.  She denies any issues in terms of swelling her inability to close her pants.  She has been drinking a bottle of wine daily for at least 7 years and drank other products prior to that.  She smokes half to 1 full pack of cigarettes per day.  No other drug use.  She has no other known liver disease.  She denies any hematemesis.  No abdominal pain fevers or chills.  Denies Pepto and iron use.  Hemoglobin on presentation is 8.7 and grossly macrocytic at 118.  Platelets 130,000 white count 14.7.  Her BUN/creatinine ratio is 41.  AST 53 ALT 22 total bili 2.4 alk phos 45  800 mg ibuprofen daily over the last several days Denies Anti-plt agents, and anticoagulants Denies family history of gastrointestinal disease and malignancy Previous Endoscopies: None     Past Medical History:  Diagnosis Date   Hypertension     History reviewed. No pertinent surgical history.  No family history of GI disease or malignancy     Pertinent GI related history and allergies were reviewed with the patient  Review of Systems  Constitutional:  Negative for activity change, appetite change, chills, diaphoresis, fatigue, fever and unexpected weight change.  HENT:  Negative for trouble swallowing and voice change.   Respiratory:  Negative for  shortness of breath and wheezing.   Cardiovascular:  Negative for chest pain, palpitations and leg swelling.  Gastrointestinal:  Positive for abdominal distention, blood in stool (dark stools/melena), diarrhea, nausea and vomiting. Negative for abdominal pain, anal bleeding, constipation and rectal pain.  Musculoskeletal:  Negative for arthralgias and myalgias.  Skin:  Negative for color change and pallor.  Neurological:  Negative for dizziness, syncope and weakness.  Psychiatric/Behavioral:  Negative for confusion.   All other systems reviewed and are negative.    Medications Home Medications No current facility-administered medications on file prior to encounter.   No current outpatient medications on file prior to encounter.   Pertinent GI related medications were reviewed with the patient  Inpatient Medications  Current Facility-Administered Medications:    0.9 %  sodium chloride infusion, , Intravenous, Continuous, Amin, Ankit Chirag, MD, Last Rate: 75 mL/hr at 10/14/22 1220, New Bag at 10/14/22 1220   dextromethorphan-guaiFENesin (MUCINEX DM) 30-600 MG per 12 hr tablet 1 tablet, 1 tablet, Oral, BID PRN, Amin, Ankit Chirag, MD   dextrose 5 %-0.45 % sodium chloride infusion, , Intravenous, Continuous, Amin, Ankit Chirag, MD   folic acid (FOLVITE) tablet 1 mg, 1 mg, Oral, Daily, Amin, Ankit Chirag, MD   hydrALAZINE (APRESOLINE) injection 10 mg, 10 mg, Intravenous, Q4H PRN, Amin, Ankit Chirag, MD   ipratropium-albuterol (DUONEB) 0.5-2.5 (3) MG/3ML nebulizer solution 3 mL, 3 mL, Nebulization, Q4H PRN, Amin, Ankit Chirag, MD   LORazepam (ATIVAN) injection 0-4 mg, 0-4 mg, Intravenous, Q6H **OR**  LORazepam (ATIVAN) tablet 0-4 mg, 0-4 mg, Oral, Q6H, Vanessa Hallstead, MD   [START ON 10/16/2022] LORazepam (ATIVAN) injection 0-4 mg, 0-4 mg, Intravenous, Q12H **OR** [START ON 10/16/2022] LORazepam (ATIVAN) tablet 0-4 mg, 0-4 mg, Oral, Q12H, Funke, Royetta Crochet, MD   LORazepam (ATIVAN) injection 1 mg, 1  mg, Intravenous, Once, Vanessa [place], MD   LORazepam (ATIVAN) tablet 1-4 mg, 1-4 mg, Oral, Q1H PRN **OR** LORazepam (ATIVAN) injection 1-4 mg, 1-4 mg, Intravenous, Q1H PRN, Amin, Ankit Chirag, MD   metoprolol tartrate (LOPRESSOR) injection 5 mg, 5 mg, Intravenous, Q4H PRN, Amin, Ankit Chirag, MD   multivitamin with minerals tablet 1 tablet, 1 tablet, Oral, Daily, Amin, Ankit Chirag, MD   octreotide (SANDOSTATIN) 2 mcg/mL load via infusion 50 mcg, 50 mcg, Intravenous, Once **AND** octreotide (SANDOSTATIN) 500 mcg in sodium chloride 0.9 % 250 mL (2 mcg/mL) infusion, 50 mcg/hr, Intravenous, Continuous, Vanessa [place], MD, Last Rate: 25 mL/hr at 10/14/22 1156, 50 mcg/hr at 10/14/22 1156   ondansetron (ZOFRAN) injection 4 mg, 4 mg, Intravenous, Q6H PRN, Amin, Jeanella Flattery, MD   [START ON 10/17/2022] pantoprazole (PROTONIX) injection 40 mg, 40 mg, Intravenous, Q12H, Vanessa [place], MD   pantoprozole (PROTONIX) 80 mg /NS 100 mL infusion, 8 mg/hr, Intravenous, Continuous, Vanessa [place], MD, Last Rate: 10 mL/hr at 10/14/22 1149, 8 mg/hr at 10/14/22 1149   potassium chloride 10 mEq in 100 mL IVPB, 10 mEq, Intravenous, Q1 Hr x 6, Amin, Ankit Chirag, MD   senna-docusate (Senokot-S) tablet 1 tablet, 1 tablet, Oral, QHS PRN, Amin, Ankit Chirag, MD   thiamine (VITAMIN B1) tablet 100 mg, 100 mg, Oral, Daily **OR** thiamine (VITAMIN B1) injection 100 mg, 100 mg, Intravenous, Daily, Amin, Ankit Chirag, MD   traZODone (DESYREL) tablet 50 mg, 50 mg, Oral, QHS PRN, Amin, Ankit Chirag, MD No current outpatient medications on file.  sodium chloride 75 mL/hr at 10/14/22 1220   dextrose 5 % and 0.45% NaCl     octreotide (SANDOSTATIN) 500 mcg in sodium chloride 0.9 % 250 mL (2 mcg/mL) infusion 50 mcg/hr (10/14/22 1156)   pantoprazole 8 mg/hr (10/14/22 1149)   potassium chloride      dextromethorphan-guaiFENesin, hydrALAZINE, ipratropium-albuterol, LORazepam **OR** LORazepam, metoprolol tartrate, ondansetron (ZOFRAN) IV,  senna-docusate, traZODone   Objective   Vitals:   10/14/22 1115 10/14/22 1130 10/14/22 1135 10/14/22 1150  BP:  (!) 122/106  (!) 131/98  Pulse: (!) 121 (!) 118 (!) 112 (!) 106  Resp: _0 Temp:      TempSrc:      SpO2: 100% 100% 100% 100%  Weight:      Height:         Physical Exam Vitals and nursing note reviewed.  Constitutional:      General: She is not in acute distress.    Appearance: She is ill-appearing. She is not toxic-appearing or diaphoretic.  HENT:     Head: Normocephalic and atraumatic.     Nose: Nose normal.     Mouth/Throat:     Mouth: Mucous membranes are moist.     Pharynx: Oropharynx is clear.  Eyes:     General: No scleral icterus.    Extraocular Movements: Extraocular movements intact.  Cardiovascular:     Rate and Rhythm: Regular rhythm. Tachycardia present.     Heart sounds: Normal heart sounds. No murmur heard.    No friction rub. No gallop.  Pulmonary:     Effort: Pulmonary effort is normal. No respiratory distress.  Breath sounds: Normal breath sounds. No wheezing, rhonchi or rales.  Abdominal:     General: Abdomen is flat. Bowel sounds are normal. There is no distension.     Palpations: Abdomen is soft.     Tenderness: There is no abdominal tenderness. There is no guarding or rebound.  Musculoskeletal:     Cervical back: Neck supple.     Right lower leg: No edema.     Left lower leg: No edema.  Skin:    General: Skin is warm and dry.     Coloration: Skin is jaundiced (mild) and pale.     Comments: Telangiectasias on the face and chest.  Slightly jaundiced appearing  Neurological:     General: No focal deficit present.     Mental Status: She is alert and oriented to person, place, and time. Mental status is at baseline.  Psychiatric:        Mood and Affect: Mood normal.        Behavior: Behavior normal.        Thought Content: Thought content normal.        Judgment: Judgment normal.     Laboratory Data Recent Labs   Lab 10/14/22 1005  WBC 14.7*  HGB 8.7*  HCT 26.7*  PLT 130*   Recent Labs  Lab 10/14/22 1005  NA 137  K 3.1*  CL 97*  CO2 29  BUN 25*  CALCIUM 8.4*  PROT 6.5  BILITOT 2.4*  ALKPHOS 145*  ALT 22  AST 53*  GLUCOSE 170*   Recent Labs  Lab 10/14/22 1119  INR 1.5*    Recent Labs    10/14/22 1005  LIPASE 148*        Imaging Studies: US ABDOMEN LIMITED RUQ (LIVER/GB)  Result Date: 10/14/2022 CLINICAL DATA:  Right upper quadrant pain for 2 weeks EXAM: ULTRASOUND ABDOMEN LIMITED RIGHT UPPER QUADRANT COMPARISON:  None Available. FINDINGS: Gallbladder: Gallstones. Mild gallbladder wall thickening. Minimal pericholecystic fluid. No sonographic Murphy sign noted by sonographer. Common bile duct: Diameter: 0.4 cm. Liver: No focal lesion identified. Increased parenchymal echogenicity. Portal vein is patent on color Doppler imaging with normal direction of blood flow towards the liver. Other: Small volume perihepatic ascites. IMPRESSION: 1. Cholelithiasis with mild gallbladder wall thickening and pericholecystic fluid, nonspecific in the setting of ascites. Negative sonographic Murphy sign. No biliary ductal dilatation. Findings are equivocal for acute cholecystitis. Consider further evaluation with HIDA scan if indicated by clinical concern for cholecystitis. 2. Hepatic steatosis. 3. Small volume perihepatic ascites. Electronically Signed   By: Delanna Ahmadi M.D.   On: 10/14/2022 11:51   DG Chest Portable 1 View  Result Date: 10/14/2022 CLINICAL DATA:  Abdominal bloating for 2 weeks EXAM: PORTABLE CHEST 1 VIEW COMPARISON:  None Available. FINDINGS: The heart size and mediastinal contours are within normal limits. No focal pulmonary opacity. No pleural effusion or pneumothorax. The visualized upper abdomen is unremarkable. No acute osseous abnormality. IMPRESSION: No acute cardiopulmonary abnormality. Electronically Signed   By: Beryle Flock M.D.   On: 10/14/2022 10:53     Assessment:   # Macrocytic anemia- suspect acute on chronic blood loss anemia in setting of melena - p/w hgb 8.7  # Alcoholic hepatitis; AST/ALT in 2:1 pattern (although only 53 and 22) - lipase elevated but <3xULN and no epigastric pain - hyperbilirubinemia - etiology of ascites - Mdf 23.6  # alcohol abuse - one bottle of wine daily for 7+ years  # Hypokalemia  # Coagulopathy- INR 1.5;  no current encephalopathy # thrombocytopenia- suspect anemia and dec plt in etoh bone marrow suppression setting  Plan:  Esophagogastroduodenoscopy planned for today pending patient stability and endoscopy suite availability NPO Labs reviewed Protonix 40 mg iv q12 h Hold dvt ppx Octreotide bolus and gtt for 72 hours total Ceftriaxone 1 g iv q24 for 7 days total for sbp ppx Monitor H&H.  Transfusion and resuscitation as per primary team Avoid frequent lab draws to prevent lab induced anemia Supportive care and antiemetics as per primary team Maintain two sites IV access Avoid nsaids Monitor for GIB.  CIWA protocol Counseled on etoh cessation [place] reviewed Request viral hepatitis panel Mdf <32- no indication for steroids  Further recommendations pending endoscopy. See op report for details.  Esophagogastroduodenoscopy with possible biopsy, control of bleeding, polypectomy, and interventions as necessary has been discussed with the patient/patient representative. Informed consent was obtained from the patient/patient representative after explaining the indication, nature, and risks of the procedure including but not limited to death, bleeding, perforation, missed neoplasm/lesions, cardiorespiratory compromise, and reaction to medications. Opportunity for questions was given and appropriate answers were provided. Patient/patient representative has verbalized understanding is amenable to undergoing the procedure.  I personally performed the service.  Management of other medical comorbidities  as per primary team  Thank you for allowing [place] to participate in this patient's care. Please don't hesitate to call if any questions or concerns arise.   Annamaria Helling, DO [facility]  Portions of the record may have been created with voice recognition software. Occasional wrong-word or 'sound-a-like' substitutions may have occurred due to the inherent limitations of voice recognition software.  Read the chart carefully and recognize, using context, where substitutions may have occurred.

## 2022-10-14 NOTE — ED Triage Notes (Signed)
Pt reports started with abdominal bloating for 2 weeks and now that the bloating has come down she is having NVD and reports her stools are black. Pt appears pale in triage. Pt reports this am she woke up dizzy and feeling more weak.

## 2022-10-14 NOTE — ED Notes (Signed)
EDP into room. Pt up to [facility] for urine sample. Family at [facility].

## 2022-10-14 NOTE — H&P (Signed)
History and Physical    Andrea Franco [id number] DOB: [birth year] DOA: 10/14/2022  PCP: Ellene Route Patient coming from: Home  Chief Complaint: Dark stools and abdominal discomfort  HPI: Andrea Franco is a 36 y.o. female with medical history significant of daily alcohol use comes to the hospital with complaints of abdominal discomfort, bloating and dark stools.  Patient states for the past several days she has had epigastric abdominal discomfort and now having dark stools for the past couple of days along with exertional dyspnea and fatigue.  She also admits of using ibuprofen quite frequently over the last couple of weeks.  Denies any fevers, chills or other complaints.  In the ED she was noted to have a hemoglobin of 8.7, baseline used to be 14, hypokalemia, transaminitis, mildly elevated lipase.  GI team was consulted.  Patient was started on PPI, octreotide drip with plans for endoscopy.  When I saw the patient she was comfortable, husband was present at bedside.   Review of Systems: As per HPI otherwise 10 point review of systems negative.  Review of Systems Otherwise negative except as per HPI, including: General: Denies fever, chills, night sweats or unintended weight loss. Resp: Denies cough, wheezing, shortness of breath. Cardiac: Denies chest pain, palpitations, orthopnea, paroxysmal nocturnal dyspnea. GI: Denies constipation GU: Denies dysuria, frequency, hesitancy or incontinence MS: Denies muscle aches, joint pain or swelling Neuro: Denies headache, neurologic deficits (focal weakness, numbness, tingling), abnormal gait Psych: Denies anxiety, depression, SI/HI/AVH Skin: Denies new rashes or lesions ID: Denies sick contacts, exotic exposures, travel  Past Medical History:  Diagnosis Date   Hypertension     History reviewed. No pertinent surgical history.  SOCIAL HISTORY:  has no history on file for tobacco use, alcohol use, and drug use.  Not on  File  FAMILY HISTORY: History reviewed. No pertinent family history.   Prior to Admission medications   Not on File    Physical Exam: Vitals:   10/14/22 1230 10/14/22 1245 10/14/22 1255 10/14/22 1255  BP: (!) 158/96 (!) 134/101  (!) 134/101  Pulse: (!) 102 (!) 108  (!) 108  Resp: 19 (!) 23    Temp:   97.8 F (36.6 C)   TempSrc:   Oral   SpO2: 100% 100%    Weight:      Height:          Constitutional: NAD, calm, comfortable Eyes: PERRL, lids and conjunctivae normal ENMT: Mucous membranes are moist. Posterior pharynx clear of any exudate or lesions.Normal dentition.  Neck: normal, supple, no masses, no thyromegaly Respiratory: clear to auscultation bilaterally, no wheezing, no crackles. Normal respiratory effort. No accessory muscle use.  Cardiovascular: Sinus tachycardia Abdomen: no tenderness, no masses palpated. No hepatosplenomegaly. Bowel sounds positive.  Musculoskeletal: no clubbing / cyanosis. No joint deformity upper and lower extremities. Good ROM, no contractures. Normal muscle tone.  Skin: no rashes, lesions, ulcers. No induration Neurologic: CN 2-12 grossly intact. Sensation intact, DTR normal. Strength 5/5 in all 4.  Psychiatric: Normal judgment and insight. Alert and oriented x 3. Normal mood.     Labs on Admission: I have personally reviewed following labs and imaging studies  CBC: Recent Labs  Lab 10/14/22 1005  WBC 14.7*  HGB 8.7*  HCT 26.7*  MCV 118.7*  PLT [id number]*   Basic Metabolic Panel: Recent Labs  Lab 10/14/22 1005  NA 137  K 3.1*  CL 97*  CO2 29  GLUCOSE 170*  BUN 25*  CREATININE 0.61  CALCIUM 8.4*  GFR: Estimated Creatinine Clearance: 76.9 mL/min (by C-G formula based on SCr of 0.61 mg/dL). Liver Function Tests: Recent Labs  Lab 10/14/22 1005  AST 53*  ALT 22  ALKPHOS 145*  BILITOT 2.4*  PROT 6.5  ALBUMIN 3.0*   Recent Labs  Lab 10/14/22 1005  LIPASE 148*   Recent Labs  Lab 10/14/22 1119  AMMONIA 36*    Coagulation Profile: Recent Labs  Lab 10/14/22 1119  INR 1.5*   Cardiac Enzymes: No results for input(s): "CKTOTAL", "CKMB", "CKMBINDEX", "TROPONINI" in the last 168 hours. BNP (last 3 results) No results for input(s): "PROBNP" in the last 8760 hours. HbA1C: No results for input(s): "HGBA1C" in the last 72 hours. CBG: No results for input(s): "GLUCAP" in the last 168 hours. Lipid Profile: No results for input(s): "CHOL", "HDL", "LDLCALC", "TRIG", "CHOLHDL", "LDLDIRECT" in the last 72 hours. Thyroid Function Tests: No results for input(s): "TSH", "T4TOTAL", "FREET4", "T3FREE", "THYROIDAB" in the last 72 hours. Anemia Panel: No results for input(s): "VITAMINB12", "FOLATE", "FERRITIN", "TIBC", "IRON", "RETICCTPCT" in the last 72 hours. Urine analysis:    Component Value Date/Time   COLORURINE AMBER (A) 10/14/2022 1005   APPEARANCEUR HAZY (A) 10/14/2022 1005   LABSPEC 1.020 10/14/2022 1005   PHURINE 5.0 10/14/2022 1005   GLUCOSEU NEGATIVE 10/14/2022 1005   HGBUR NEGATIVE 10/14/2022 1005   BILIRUBINUR NEGATIVE 10/14/2022 1005   KETONESUR 5 (A) 10/14/2022 1005   PROTEINUR NEGATIVE 10/14/2022 1005   NITRITE NEGATIVE 10/14/2022 1005   LEUKOCYTESUR NEGATIVE 10/14/2022 1005   Sepsis Labs: !!!!!!!!!!!!!!!!!!!!!!!!!!!!!!!!!!!!!!!!!!!! @LABRCNTIP (procalcitonin:4,lacticidven:4) ) Recent Results (from the past 240 hour(s))  Resp Panel by RT-PCR (Flu A&B, Covid) Anterior Nasal Swab     Status: None   Collection Time: 10/14/22 11:19 AM   Specimen: Anterior Nasal Swab  Result Value Ref Range Status   SARS Coronavirus 2 by RT PCR NEGATIVE NEGATIVE Final    Comment: (NOTE) SARS-CoV-2 target nucleic acids are NOT DETECTED.  The SARS-CoV-2 RNA is generally detectable in upper respiratory specimens during the acute phase of infection. The lowest concentration of SARS-CoV-2 viral copies this assay can detect is 138 copies/mL. A negative result does not preclude SARS-Cov-2 infection and  should not be used as the sole basis for treatment or other patient management decisions. A negative result may occur with  improper specimen collection/handling, submission of specimen other than nasopharyngeal swab, presence of viral mutation(s) within the areas targeted by this assay, and inadequate number of viral copies(<138 copies/mL). A negative result must be combined with clinical observations, patient history, and epidemiological information. The expected result is Negative.  Fact Sheet for Patients:  13/11/23  Fact Sheet for Healthcare Providers:  BloggerCourse.com  This test is no t yet approved or cleared by the [employer] FDA and  has been authorized for detection and/or diagnosis of SARS-CoV-2 by FDA under an Emergency Use Authorization (EUA). This EUA will remain  in effect (meaning this test can be used) for the duration of the COVID-19 declaration under Section 564(b)(1) of the Act, 21 U.S.C.section 360bbb-3(b)(1), unless the authorization is terminated  or revoked sooner.       Influenza A by PCR NEGATIVE NEGATIVE Final   Influenza B by PCR NEGATIVE NEGATIVE Final    Comment: (NOTE) The Xpert Xpress SARS-CoV-2/FLU/RSV plus assay is intended as an aid in the diagnosis of influenza from Nasopharyngeal swab specimens and should not be used as a sole basis for treatment. Nasal washings and aspirates are unacceptable for Xpert Xpress SARS-CoV-2/FLU/RSV testing.  Fact  Sheet for Patients: EntrepreneurPulse.com.au  Fact Sheet for Healthcare Providers: IncredibleEmployment.be  This test is not yet approved or cleared by the [place] FDA and has been authorized for detection and/or diagnosis of SARS-CoV-2 by FDA under an Emergency Use Authorization (EUA). This EUA will remain in effect (meaning this test can be used) for the duration of the COVID-19 declaration  under Section 564(b)(1) of the Act, 21 U.S.C. section 360bbb-3(b)(1), unless the authorization is terminated or revoked.  Performed at [facility], [address] [place], [place] [address]      Radiological Exams on Admission: US ABDOMEN LIMITED RUQ (LIVER/GB)  Result Date: 10/14/2022 CLINICAL DATA:  Right upper quadrant pain for 2 weeks EXAM: ULTRASOUND ABDOMEN LIMITED RIGHT UPPER QUADRANT COMPARISON:  None Available. FINDINGS: Gallbladder: Gallstones. Mild gallbladder wall thickening. Minimal pericholecystic fluid. No sonographic Murphy sign noted by sonographer. Common bile duct: Diameter: 0.4 cm. Liver: No focal lesion identified. Increased parenchymal echogenicity. Portal vein is patent on color Doppler imaging with normal direction of blood flow towards the liver. Other: Small volume perihepatic ascites. IMPRESSION: 1. Cholelithiasis with mild gallbladder wall thickening and pericholecystic fluid, nonspecific in the setting of ascites. Negative sonographic Murphy sign. No biliary ductal dilatation. Findings are equivocal for acute cholecystitis. Consider further evaluation with HIDA scan if indicated by clinical concern for cholecystitis. 2. Hepatic steatosis. 3. Small volume perihepatic ascites. Electronically Signed   By: Delanna Ahmadi M.D.   On: 10/14/2022 11:51   DG Chest Portable 1 View  Result Date: 10/14/2022 CLINICAL DATA:  Abdominal bloating for 2 weeks EXAM: PORTABLE CHEST 1 VIEW COMPARISON:  None Available. FINDINGS: The heart size and mediastinal contours are within normal limits. No focal pulmonary opacity. No pleural effusion or pneumothorax. The visualized upper abdomen is unremarkable. No acute osseous abnormality. IMPRESSION: No acute cardiopulmonary abnormality. Electronically Signed   By: Beryle Flock M.D.   On: 10/14/2022 10:53     All images have been reviewed by me personally.  EKG: Independently reviewed.  Sinus  tachycardia  Assessment/Plan Principal Problem:   GI bleed Active Problems:   Alcohol abuse   Hypokalemia   Transaminitis    Acute blood loss anemia, upper GI bleed - Admit patient to the hospital.  Baseline hemoglobin around 13.  Admission hemoglobin 8.7.  Suspicion for gastritis/ulcer/Mallory-Weiss.  Plans for endoscopy.  GI following.  PPI IV twice daily.  Octreotide drip.  On empiric IV Rocephin.  Supportive care.  On D5 half-normal saline while NPO  Alcohol use Alcoholic hepatitis; Elevated Lipase.  - Supportive care, follow LFTs for now.  Has been drinking at least 1 bottle of wine for past 10 years.  Currently alcohol withdrawal protocol.  Lactic acidosis - Getting IV fluids  Gallstones with gallbladder distention concerns for acute cholecystitis - This is seen on right upper quadrant ultrasound.  Will discuss with general surgery for them but  Hypokalemia - As needed repletion    DVT prophylaxis: SCDs Code Status: Full code Family Communication: Husband at bedside Consults called: Gastroenterology Admission status: Inpatient admission  Status is: Inpatient Remains inpatient appropriate because: Concerns of active GI bleed.   Time Spent: 65 minutes.  >50% of the time was devoted to discussing the patients care, assessment, plan and disposition with other care givers along with counseling the patient about the risks and benefits of treatment.    Keishawn Darsey Arsenio Loader MD [facility]  If 7PM-7AM, please contact night-coverage   10/14/2022, 1:27 PM

## 2022-10-14 NOTE — Consult Note (Signed)
Date of Consultation:  10/14/2022  Requesting Physician:  Stephania Fragmin, MD  Reason for Consultation:  Possible cholecystitis  History of Present Illness: Andrea Franco is a 36 y.o. female presenting with epigastric abdominal pain associated with nausea and vomiting.  The patient reports that she has been having over the last 2 days epigastric abdominal pain as well as a blood in her stool.  As she started having emesis as well, she is noted also blood in the emesis.  Denies any fevers or chills any chest pain or shortness of breath.  The pain is mostly in the epigastric area a little bit sometimes in the periumbilical area.  Denies any pain in the right upper quadrant.  Due to the pain, she presented to the emergency room today and part of her work-up showed a hemoglobin of 8.7 down from a prior of 14.2 in 07/2020, ammonia level 36, lactic acid of 2.3, total bilirubin 2.4, AST 53, ALT 22, albumin 3, lipase 148.  The patient does have a history of daily alcohol use.  Due to the elevated LFTs, she had an ultrasound of the right upper quadrant which showed cholelithiasis with mild gallbladder wall thickening and mild pericholecystic fluid.  However this was in the setting of ascites.  The patient had an EGD with Dr. Timothy Lasso with gastroenterology which showed extensive issues with diffuse moderate inflammation characterized by erosions and erythema in the duodenal bulb and second portion of the duodenum, and nonbleeding superficial duodenal ulcer in the first portion of the duodenum, diffuse severe inflammation in the entire stomach, and nonbleeding superficial gastric ulcer in the gastric antrum, and grade D esophagitis as well.  She has been given IV Protonix loading, IV octreotide loading, and also a unit of PRBC transfusion.  I am seeing the patient after her EGD was completed.  She is in the recovery area resting well and reports that she is not having any pain currently as well as no nausea or emesis.  She  does report soreness in the mid abdomen but otherwise no significant pain.  Past Medical History: Past Medical History:  Diagnosis Date   Hypertension      Past Surgical History: History reviewed. No pertinent surgical history.  Home Medications: Prior to Admission medications   Not on File    Allergies: No Known Allergies  Social History:  reports that she has been smoking cigarettes. She has never used smokeless tobacco. She reports current alcohol use. She reports that she does not use drugs.   Family History: There is family history of depression, hyperlipidemia, glaucoma, high blood pressure, substance abuse, Alzheimer's disease, diabetes type 2.  Review of Systems: Review of Systems  Constitutional:  Negative for chills and fever.  Respiratory:  Negative for shortness of breath.   Cardiovascular:  Negative for chest pain.  Gastrointestinal:  Positive for abdominal pain, blood in stool, nausea and vomiting.  Genitourinary:  Negative for dysuria.  Musculoskeletal:  Negative for myalgias.  Neurological:  Negative for dizziness.  Psychiatric/Behavioral:  Negative for depression.     Physical Exam BP (!) 153/96   Pulse 94   Temp 98 F (36.7 C) (Temporal)   Resp 17   Ht 5\' 2"  (1.575 m)   Wt 54 kg   LMP 09/19/2022 (Approximate)   SpO2 98%   BMI 21.77 kg/m  CONSTITUTIONAL: No acute distress, resting comfortably HEENT:  Normocephalic, atraumatic, extraocular motion intact. RESPIRATORY:  Normal respiratory effort without pathologic use of accessory muscles. CARDIOVASCULAR: Regular rhythm and  rate. GI: The abdomen is soft, nondistended, nontender to palpation.  Patient has some mild soreness in the periumbilical area but otherwise no tenderness.  Negative Murphy's sign.  MUSCULOSKELETAL:  Normal muscle strength and tone in all four extremities.  No peripheral edema or cyanosis.  NEUROLOGIC:  Motor and sensation is grossly normal.  Cranial nerves are grossly  intact. PSYCH:  Alert and oriented to person, place and time. Affect is normal.  Laboratory Analysis: Results for orders placed or performed during the hospital encounter of 10/14/22 (from the past 24 hour(s))  Lipase, blood     Status: Abnormal   Collection Time: 10/14/22 10:05 AM  Result Value Ref Range   Lipase 148 (H) 11 - 51 U/L  Comprehensive metabolic panel     Status: Abnormal   Collection Time: 10/14/22 10:05 AM  Result Value Ref Range   Sodium 137 135 - 145 mmol/L   Potassium 3.1 (L) 3.5 - 5.1 mmol/L   Chloride 97 (L) 98 - 111 mmol/L   CO2 29 22 - 32 mmol/L   Glucose, Bld 170 (H) 70 - 99 mg/dL   BUN 25 (H) 6 - 20 mg/dL   Creatinine, Ser 0.07 0.44 - 1.00 mg/dL   Calcium 8.4 (L) 8.9 - 10.3 mg/dL   Total Protein 6.5 6.5 - 8.1 g/dL   Albumin 3.0 (L) 3.5 - 5.0 g/dL   AST 53 (H) 15 - 41 U/L   ALT 22 0 - 44 U/L   Alkaline Phosphatase 145 (H) 38 - 126 U/L   Total Bilirubin 2.4 (H) 0.3 - 1.2 mg/dL   GFR, Estimated >62 >26 mL/min   Anion gap 11 5 - 15  CBC     Status: Abnormal   Collection Time: 10/14/22 10:05 AM  Result Value Ref Range   WBC 14.7 (H) 4.0 - 10.5 K/uL   RBC 2.25 (L) 3.87 - 5.11 MIL/uL   Hemoglobin 8.7 (L) 12.0 - 15.0 g/dL   HCT 33.3 (L) 54.5 - 62.5 %   MCV 118.7 (H) 80.0 - 100.0 fL   MCH 38.7 (H) 26.0 - 34.0 pg   MCHC 32.6 30.0 - 36.0 g/dL   RDW 63.8 93.7 - 34.2 %   Platelets 130 (L) 150 - 400 K/uL   nRBC 0.1 0.0 - 0.2 %  Urinalysis, Routine w reflex microscopic     Status: Abnormal   Collection Time: 10/14/22 10:05 AM  Result Value Ref Range   Color, Urine AMBER (A) YELLOW   APPearance HAZY (A) CLEAR   Specific Gravity, Urine 1.020 1.005 - 1.030   pH 5.0 5.0 - 8.0   Glucose, UA NEGATIVE NEGATIVE mg/dL   Hgb urine dipstick NEGATIVE NEGATIVE   Bilirubin Urine NEGATIVE NEGATIVE   Ketones, ur 5 (A) NEGATIVE mg/dL   Protein, ur NEGATIVE NEGATIVE mg/dL   Nitrite NEGATIVE NEGATIVE   Leukocytes,Ua NEGATIVE NEGATIVE  ABO/Rh     Status: None   Collection  Time: 10/14/22 10:05 AM  Result Value Ref Range   ABO/RH(D)      A POS Performed at [facility], [address] [place], [place] [address]   Prepare RBC (crossmatch)     Status: None   Collection Time: 10/14/22 10:37 AM  Result Value Ref Range   Order Confirmation      ORDER PROCESSED BY BLOOD BANK Performed at [facility], [address], [place], [place] [address]   Protime-INR     Status: Abnormal   Collection Time: 10/14/22 11:19  AM  Result Value Ref Range   Prothrombin Time 17.9 (H) 11.4 - 15.2 seconds   INR 1.5 (H) 0.8 - 1.2  Type and screen [facility]     Status: None (Preliminary result)   Collection Time: 10/14/22 11:19 AM  Result Value Ref Range   ABO/RH(D) A POS    Antibody Screen NEG    Sample Expiration 10/17/2022,2359    Unit Number [id number]    Blood Component Type RED CELLS,LR    Unit division 00    Status of Unit ISSUED    Transfusion Status OK TO TRANSFUSE    Crossmatch Result      Compatible Performed at [facility], [address], [place], [place] [address]    Unit Number [id number]    Blood Component Type RED CELLS,LR    Unit division 00    Status of Unit ALLOCATED    Transfusion Status OK TO TRANSFUSE    Crossmatch Result Compatible   Pregnancy, urine     Status: None   Collection Time: 10/14/22 11:19 AM  Result Value Ref Range   Preg Test, Ur NEGATIVE NEGATIVE  Resp Panel by RT-PCR (Flu A&B, Covid) Anterior Nasal Swab     Status: None   Collection Time: 10/14/22 11:19 AM   Specimen: Anterior Nasal Swab  Result Value Ref Range   SARS Coronavirus 2 by RT PCR NEGATIVE NEGATIVE   Influenza A by PCR NEGATIVE NEGATIVE   Influenza B by PCR NEGATIVE NEGATIVE  Ammonia     Status: Abnormal   Collection Time: 10/14/22 11:19 AM  Result Value Ref Range   Ammonia 36 (H) 9 - 35 umol/L  Lactic acid, plasma     Status: Abnormal   Collection Time: 10/14/22 11:19 AM  Result Value Ref Range    Lactic Acid, Venous 2.3 (HH) 0.5 - 1.9 mmol/L  Troponin I (High Sensitivity)     Status: Abnormal   Collection Time: 10/14/22 11:19 AM  Result Value Ref Range   Troponin I (High Sensitivity) 42 (H) <18 ng/L  Ferritin     Status: Abnormal   Collection Time: 10/14/22 11:19 AM  Result Value Ref Range   Ferritin 389 (H) 11 - 307 ng/mL  Iron and TIBC     Status: Abnormal   Collection Time: 10/14/22 11:19 AM  Result Value Ref Range   Iron 119 28 - 170 ug/dL   TIBC 614 (L) 431 - 540 ug/dL   Saturation Ratios 53 (H) 10.4 - 31.8 %   UIBC 106 ug/dL  TSH     Status: None   Collection Time: 10/14/22 11:19 AM  Result Value Ref Range   TSH 2.396 0.350 - 4.500 uIU/mL  Troponin I (High Sensitivity)     Status: Abnormal   Collection Time: 10/14/22 11:19 AM  Result Value Ref Range   Troponin I (High Sensitivity) 43 (H) <18 ng/L    Imaging: US ABDOMEN LIMITED RUQ (LIVER/GB)  Result Date: 10/14/2022 CLINICAL DATA:  Right upper quadrant pain for 2 weeks EXAM: ULTRASOUND ABDOMEN LIMITED RIGHT UPPER QUADRANT COMPARISON:  None Available. FINDINGS: Gallbladder: Gallstones. Mild gallbladder wall thickening. Minimal pericholecystic fluid. No sonographic Murphy sign noted by sonographer. Common bile duct: Diameter: 0.4 cm. Liver: No focal lesion identified. Increased parenchymal echogenicity. Portal vein is patent on color Doppler imaging with normal direction of blood flow towards the liver. Other: Small volume perihepatic ascites. IMPRESSION: 1. Cholelithiasis with mild gallbladder wall thickening and pericholecystic fluid, nonspecific in the setting  of ascites. Negative sonographic Murphy sign. No biliary ductal dilatation. Findings are equivocal for acute cholecystitis. Consider further evaluation with HIDA scan if indicated by clinical concern for cholecystitis. 2. Hepatic steatosis. 3. Small volume perihepatic ascites. Electronically Signed   By: Alex D Bibbey M.D.   On: 10/14/2022 11:51   DG Jearld Leschhest  Portable 1 View  Result Date: 10/14/2022 CLINICAL DATA:  Abdominal bloating for 2 weeks EXAM: PORTABLE CHEST 1 VIEW COMPARISON:  None Available. FINDINGS: The heart size and mediastinal contours are within normal limits. No focal pulmonary opacity. No pleural effusion or pneumothorax. The visualized upper abdomen is unremarkable. No acute osseous abnormality. IMPRESSION: No acute cardiopulmonary abnormality. Electronically Signed   By: Jacob MooresMeghana  Konanur M.D.   On: 10/14/2022 10:53    Assessment and Plan: This is a 36 y.o. female with epigastric abdominal pain.  - Discussed with patient the findings on her ultrasound and laboratory studies was a endoscopy earlier today.  Overall I think her story is more consistent with the findings on her EGD rather than cholecystitis.  She does have a history of alcohol use as well is taking NSAIDs for pain, and her EGD shows different areas of ulceration which could cause the bleeding and could cause abdominal pain.  Given her alcohol use, not surprising that she also may have some ascites and changes of liver disease.  I think for now, we can observe her rather than rush into any potential surgery.  Discussed with her that she can start with her full liquid diet as ordered by Dr. Timothy Lassousso and if the pain gets any worse, or if it moves more towards the right upper quadrant, then we will obtain a HIDA scan to further evaluate the gallbladder.  Otherwise, her cholelithiasis may be more of an incidental finding. - We will continue to follow with you.  No antibiotics needed at this point.  Avoid NSAIDs.  I spent 45 minutes dedicated to the care of this patient on the date of this encounter to include pre-visit review of records, face-to-face time with the patient discussing diagnosis and management, and any post-visit coordination of care.   Howie IllJose Luis Page Pucciarelli, MD [facility]:  [phone]

## 2022-10-14 NOTE — ED Notes (Signed)
Admitting at BS

## 2022-10-14 NOTE — Anesthesia Preprocedure Evaluation (Addendum)
Anesthesia Evaluation  Patient identified by MRN, date of birth, ID band Patient awake    Reviewed: Allergy & Precautions, NPO status , Patient's Chart, lab work & pertinent test results  History of Anesthesia Complications Negative for: history of anesthetic complications  Airway Mallampati: II  TM Distance: >3 FB Neck ROM: full    Dental  (+) Chipped   Pulmonary neg pulmonary ROS   Pulmonary exam normal        Cardiovascular hypertension, Normal cardiovascular exam     Neuro/Psych negative neurological ROS  negative psych ROS   GI/Hepatic ,,,(+)     substance abuse (drinks 1 bottle of wine per night)  alcohol useN/v, GI bleed    Endo/Other  negative endocrine ROS    Renal/GU negative Renal ROS  negative genitourinary   Musculoskeletal   Abdominal   Peds  Hematology  (+) Blood dyscrasia, anemia Thrombocytopenia    Anesthesia Other Findings Past Medical History: No date: Hypertension  History reviewed. No pertinent surgical history.  BMI    Body Mass Index: 21.77 kg/m      Reproductive/Obstetrics negative OB ROS                             Anesthesia Physical Anesthesia Plan  ASA: 2  Anesthesia Plan: General   Post-op Pain Management: Minimal or no pain anticipated   Induction: Intravenous  PONV Risk Score and Plan: Propofol infusion and TIVA  Airway Management Planned: Oral ETT  Additional Equipment:   Intra-op Plan:   Post-operative Plan:   Informed Consent: I have reviewed the patients History and Physical, chart, labs and discussed the procedure including the risks, benefits and alternatives for the proposed anesthesia with the patient or authorized representative who has indicated his/her understanding and acceptance.     Dental Advisory Given  Plan Discussed with: Anesthesiologist, CRNA and Surgeon  Anesthesia Plan Comments: (Patient consented for  risks of anesthesia including but not limited to:  - adverse reactions to medications - risk of airway placement if required - damage to eyes, teeth, lips or other oral mucosa - nerve damage due to positioning  - sore throat or hoarseness - Damage to heart, brain, nerves, lungs, other parts of body or loss of life  Patient voiced understanding.)        Anesthesia Quick Evaluation

## 2022-10-14 NOTE — Op Note (Signed)
[facility] Gastroenterology Patient Name: Andrea Franco Procedure Date: 10/14/2022 2:33 PM MRN: [id number] Account #: [id number] Date of Birth: 02/03/1986 Admit Type: Inpatient Age: 36 Room: [facility] ENDO ROOM 4 Gender: Female Note Status: Finalized Instrument Name: Upper Endoscope [id number] Procedure:             Upper GI endoscopy Indications:           Melena Providers:             Trenda Moots, DO Referring MD:          [facility] (Referring MD) Sherrie Sport Medicines:             Monitored Anesthesia Care Complications:         No immediate complications. Estimated blood loss:                         Minimal. Procedure:             Pre-Anesthesia Assessment:                        - Prior to the procedure, a History and Physical was                         performed, and patient medications and allergies were                         reviewed. The patient is competent. The risks and                         benefits of the procedure and the sedation options and                         risks were discussed with the patient. All questions                         were answered and informed consent was obtained.                         Patient identification and proposed procedure were                         verified by the physician, the nurse, the                         anesthesiologist, the anesthetist and the technician                         in the endoscopy suite. Mental Status Examination:                         alert and oriented. Airway Examination: normal                         oropharyngeal airway and neck mobility. Respiratory                         Examination: clear to auscultation. CV Examination:  RRR, no murmurs, no S3 or S4. Prophylactic                         Antibiotics: The patient requires prophylactic                         antibiotics due to a prior history of acute GI                          bleeding. Prior Anticoagulants: The patient has taken                         no anticoagulant or antiplatelet agents. ASA Grade                         Assessment: III - A patient with severe systemic                         disease. After reviewing the risks and benefits, the                         patient was deemed in satisfactory condition to                         undergo the procedure. The anesthesia plan was to use                         general anesthesia. Immediately prior to                         administration of medications, the patient was                         re-assessed for adequacy to receive sedatives. The                         heart rate, respiratory rate, oxygen saturations,                         blood pressure, adequacy of pulmonary ventilation, and                         response to care were monitored throughout the                         procedure. The physical status of the patient was                         re-assessed after the procedure.                        After obtaining informed consent, the endoscope was                         passed under direct vision. Throughout the procedure,                         the patient's blood pressure, pulse, and oxygen  saturations were monitored continuously. The Endoscope                         was introduced through the mouth, and advanced to the                         second part of duodenum. The upper GI endoscopy was                         accomplished without difficulty. The patient tolerated                         the procedure well. Findings:      Diffuse moderate inflammation characterized by erosions and erythema was       found in the duodenal bulb, in the first portion of the duodenum and in       the second portion of the duodenum. Estimated blood loss: none.      One non-bleeding superficial duodenal ulcer with a clean ulcer base       (Forrest Class III) was found  in the first portion of the duodenum. The       lesion was 3 mm in largest dimension. Estimated blood loss: none.      Diffuse severe inflammation oozing characterized by adherent blood,       congestion (edema), erosions, erythema, friability and granularity was       found in the entire examined stomach. Lavage of the area was performed       using a large amount, resulting in clearance with adequate       visualization. no other ulcer aside from antral ulcer noted below.       Estimated blood loss: none.      One non-bleeding superficial gastric ulcer with a clean ulcer base       (Forrest Class III) was found in the gastric antrum. The lesion was 4 mm       in largest dimension. Estimated blood loss: none.      No gastric varices or GAVE seen. Gastropathy noted in the gastric body.       Oozes to touch. Spontaneous areas of oozing. Estimated blood loss was       minimal.      LA Grade D (one or more mucosal breaks involving at least 75% of       esophageal circumference) esophagitis with no bleeding was found.       Estimated blood loss: none. Difficult to assess for any varices or z       line given significant esophagitis. No red wale or nipple sign.      Esophagogastric landmarks were identified: the gastroesophageal junction       was found at 36 cm from the incisors. Impression:            - Duodenitis.                        - Non-bleeding duodenal ulcer with a clean ulcer base                         (Forrest Class III).                        - Gastritis oozing.                        -  Non-bleeding gastric ulcer with a clean ulcer base                         (Forrest Class III).                        - LA Grade D esophagitis with no bleeding.                        - Esophagogastric landmarks identified.                        - No specimens collected. Recommendation:        - Patient has a contact number available for                         emergencies. The signs and  symptoms of potential                         delayed complications were discussed with the patient.                         Return to normal activities tomorrow. Written                         discharge instructions were provided to the patient.                        - Return patient to hospital ward for ongoing care.                        - Full liquid diet.                        - No aspirin, ibuprofen, naproxen, or other                         non-steroidal anti-inflammatory drugs.                        - Continue present medications.                        - continue bid ppi iv. transition to po 40 mg                         omeprazole bid for 8 weeks.                        can discontinue octreotide and antibiotics.                        - Repeat upper endoscopy in 8 weeks to evaluate the                         response to therapy.                        - Return to primary care physician at appointment to  be scheduled.                        - The findings and recommendations were discussed with                         the patient.                        - The findings and recommendations were discussed with                         the patient's family.                        - The findings and recommendations were discussed with                         the referring physician. Procedure Code(s):     --- Professional ---                        [phone], Esophagogastroduodenoscopy, flexible,                         transoral; diagnostic, including collection of                         specimen(s) by brushing or washing, when performed                         (separate procedure) Diagnosis Code(s):     --- Professional ---                        K29.80, Duodenitis without bleeding                        K26.9, Duodenal ulcer, unspecified as acute or                         chronic, without hemorrhage or perforation                        K25.9,  Gastric ulcer, unspecified as acute or chronic,                         without hemorrhage or perforation                        K20.90, Esophagitis, unspecified without bleeding                        K92.1, Melena (includes Hematochezia) CPT copyright 2022 American Medical Association. All rights reserved. The codes documented in this report are preliminary and upon coder review may  be revised to meet current compliance requirements. Attending Participation:      I personally performed the entire procedure. Elfredia Nevins, DO Jaynie Collins DO, DO 10/14/2022 3:08:55 PM This report has been signed electronically. Number of Addenda: 0 Note Initiated On: 10/14/2022 2:33 PM Estimated Blood Loss:  Estimated blood loss was minimal.      [facility]

## 2022-10-14 NOTE — Transfer of Care (Signed)
Immediate Anesthesia Transfer of Care Note  Patient: Andrea Franco  Procedure(s) Performed: ESOPHAGOGASTRODUODENOSCOPY (EGD) WITH PROPOFOL  Patient Location: Endo  Anesthesia Type:General  Level of Consciousness: awake and alert   Airway & Oxygen Therapy: Patient Spontanous Breathing and Patient connected to face mask oxygen  Post-op Assessment: Report given to RN and Post -op Vital signs reviewed and stable  Post vital signs: Reviewed and stable  Last Vitals:  Vitals Value Taken Time  BP 141/88 10/14/22 1509  Temp    Pulse 114 10/14/22 1511  Resp 17 10/14/22 1511  SpO2 99 % 10/14/22 1511  Vitals shown include unvalidated device data.  Last Pain:  Vitals:   10/14/22 1255  TempSrc: Oral  PainSc:          Complications: No notable events documented.

## 2022-10-14 NOTE — Anesthesia Procedure Notes (Signed)
Procedure Name: Intubation Date/Time: 10/14/2022 2:46 PM  Performed by: Malva Cogan, CRNAPre-anesthesia Checklist: Patient identified, Patient being monitored, Timeout performed, Emergency Drugs available and Suction available Patient Re-evaluated:Patient Re-evaluated prior to induction Oxygen Delivery Method: Circle system utilized Preoxygenation: Pre-oxygenation with 100% oxygen Induction Type: IV induction and Rapid sequence Laryngoscope Size: 3 and McGraph Grade View: Grade I Tube type: Oral Tube size: 7.0 mm Number of attempts: 1 Airway Equipment and Method: Stylet Placement Confirmation: ETT inserted through vocal cords under direct vision, positive ETCO2 and breath sounds checked- equal and bilateral Secured at: 21 cm Tube secured with: Tape Dental Injury: Teeth and Oropharynx as per pre-operative assessment

## 2022-10-14 NOTE — ED Notes (Signed)
GI at BS

## 2022-10-14 NOTE — Progress Notes (Signed)
Room 260 is now clean and ready. Patient transported via stretcher to room.

## 2022-10-14 NOTE — ED Provider Notes (Signed)
[facility] Provider Note    Event Date/Time   First MD Initiated Contact with Patient 10/14/22 1019     (approximate)   History   Abdominal Pain and Diarrhea   HPI  Andrea Franco is a 36 y.o. female who comes in with nausea vomiting diarrhea concern for black stools.  She reports not feeling well for the past 2 weeks with abdominal bloating.  Patient reports that she does drink a bottle of wine daily.  She reports that she last drank over 24 hours ago.  She does report a little bit of a tremor but no history of seizure or withdrawal.  Although she states that she has been a long time since she is never not drinking.  Over 2 year ago I reviewed her records where she had some elevated liver test with negative hepatitis panel.  She reports that she was never told of any additional issues when it was rechecked a year ago however she continues to drink daily.  She denies any IV drug use, travel out of the [place].  She denies any significant chest pain.  She reports having some distention in her upper abdomen which she reports has actually gotten little bit better today but she reports having some vomiting with dark substance 2 days ago and now developing dark black tarry stools.  Physical Exam   Triage Vital Signs: ED Triage Vitals  Enc Vitals Group     BP 10/14/22 1003 (!) 147/90     Pulse Rate 10/14/22 1003 (!) 137     Resp 10/14/22 1003 20     Temp 10/14/22 1003 97.6 F (36.4 C)     Temp Source 10/14/22 1003 Oral     SpO2 10/14/22 1003 100 %     Weight 10/14/22 1000 119 lb (54 kg)     Height 10/14/22 1000 5\' 2"  (1.575 m)     Head Circumference --      Peak Flow --      Pain Score 10/14/22 1000 4     Pain Loc --      Pain Edu? --      Excl. in GC? --     Most recent vital signs: Vitals:   10/14/22 1003  BP: (!) 147/90  Pulse: (!) 137  Resp: 20  Temp: 97.6 F (36.4 C)  SpO2: 100%     General: Awake, no distress.  CV:  Good peripheral  perfusion.  Resp:  Normal effort.  Abd:  Soft and nontender Other:  Stool appears melanotic on examination.  Positive Hemoccult.  Abdomen is soft and nontender slightly distended   ED Results / Procedures / Treatments   Labs (all labs ordered are listed, but only abnormal results are displayed) Labs Reviewed  LIPASE, BLOOD - Abnormal; Notable for the following components:      Result Value   Lipase 148 (*)    All other components within normal limits  COMPREHENSIVE METABOLIC PANEL - Abnormal; Notable for the following components:   Potassium 3.1 (*)    Chloride 97 (*)    Glucose, Bld 170 (*)    BUN 25 (*)    Calcium 8.4 (*)    Albumin 3.0 (*)    AST 53 (*)    Alkaline Phosphatase 145 (*)    Total Bilirubin 2.4 (*)    All other components within normal limits  CBC - Abnormal; Notable for the following components:   WBC 14.7 (*)  RBC 2.25 (*)    Hemoglobin 8.7 (*)    HCT 26.7 (*)    MCV 118.7 (*)    MCH 38.7 (*)    Platelets 130 (*)    All other components within normal limits  URINALYSIS, ROUTINE W REFLEX MICROSCOPIC - Abnormal; Notable for the following components:   Color, Urine AMBER (*)    APPearance HAZY (*)    Ketones, ur 5 (*)    All other components within normal limits  PROTIME-INR - Abnormal; Notable for the following components:   Prothrombin Time 17.9 (*)    INR 1.5 (*)    All other components within normal limits  RESP PANEL BY RT-PCR (FLU A&B, COVID) ARPGX2  CULTURE, BLOOD (ROUTINE X 2)  CULTURE, BLOOD (ROUTINE X 2)  PREGNANCY, URINE  AMMONIA  LACTIC ACID, PLASMA  LACTIC ACID, PLASMA  FERRITIN  IRON AND TIBC  TSH  VITAMIN B12  HIV ANTIBODY (ROUTINE TESTING W REFLEX)  TYPE AND SCREEN  PREPARE RBC (CROSSMATCH)  ABO/RH  TROPONIN I (HIGH SENSITIVITY)  TROPONIN I (HIGH SENSITIVITY)     EKG  My interpretation of EKG:  Sinus tachycardia rate of 125 without any ST elevation or T wave inversion except for lead III with PVC.  Normal intervals  otherwise  RADIOLOGY I have reviewed the xray personally and interpreted no evidence of any free air   PROCEDURES:  Critical Care performed: Yes, see critical care procedure note(s)  .1-3 Lead EKG Interpretation  Performed by: Concha Se, MD Authorized by: Concha Se, MD     Interpretation: abnormal     ECG rate:  110   ECG rate assessment: tachycardic     Rhythm: sinus tachycardia     Ectopy: none     Conduction: normal   .Critical Care  Performed by: Concha Se, MD Authorized by: Concha Se, MD   Critical care provider statement:    Critical care time (minutes):  30   Critical care was necessary to treat or prevent imminent or life-threatening deterioration of the following conditions: Gi bleed.   Critical care was time spent personally by me on the following activities:  Development of treatment plan with patient or surrogate, discussions with consultants, evaluation of patient's response to treatment, examination of patient, ordering and review of laboratory studies, ordering and review of radiographic studies, ordering and performing treatments and interventions, pulse oximetry, re-evaluation of patient's condition and review of old charts    MEDICATIONS ORDERED IN ED: Medications  LORazepam (ATIVAN) injection 0-4 mg ( Intravenous Not Given 10/14/22 1111)    Or  LORazepam (ATIVAN) tablet 0-4 mg ( Oral See Alternative 10/14/22 1111)  LORazepam (ATIVAN) injection 0-4 mg (has no administration in time range)    Or  LORazepam (ATIVAN) tablet 0-4 mg (has no administration in time range)  pantoprozole (PROTONIX) 80 mg /NS 100 mL infusion (8 mg/hr Intravenous New Bag/Given 10/14/22 1149)  pantoprazole (PROTONIX) injection 40 mg (has no administration in time range)  octreotide (SANDOSTATIN) 2 mcg/mL load via infusion 50 mcg (has no administration in time range)    And  octreotide (SANDOSTATIN) 500 mcg in sodium chloride 0.9 % 250 mL (2 mcg/mL) infusion (50 mcg/hr  Intravenous New Bag/Given 10/14/22 1156)  LORazepam (ATIVAN) injection 1 mg (1 mg Intravenous Not Given 10/14/22 1110)  ipratropium-albuterol (DUONEB) 0.5-2.5 (3) MG/3ML nebulizer solution 3 mL (has no administration in time range)  hydrALAZINE (APRESOLINE) injection 10 mg (has no administration in time range)  metoprolol  tartrate (LOPRESSOR) injection 5 mg (has no administration in time range)  0.9 %  sodium chloride infusion (has no administration in time range)  dextromethorphan-guaiFENesin (MUCINEX DM) 30-600 MG per 12 hr tablet 1 tablet (has no administration in time range)  senna-docusate (Senokot-S) tablet 1 tablet (has no administration in time range)  traZODone (DESYREL) tablet 50 mg (has no administration in time range)  ondansetron (ZOFRAN) injection 4 mg (has no administration in time range)  potassium chloride 10 mEq in 100 mL IVPB (has no administration in time range)  dextrose 5 %-0.45 % sodium chloride infusion (has no administration in time range)  LORazepam (ATIVAN) tablet 1-4 mg (has no administration in time range)    Or  LORazepam (ATIVAN) injection 1-4 mg (has no administration in time range)  thiamine (VITAMIN B1) tablet 100 mg (has no administration in time range)    Or  thiamine (VITAMIN B1) injection 100 mg (has no administration in time range)  folic acid (FOLVITE) tablet 1 mg (has no administration in time range)  multivitamin with minerals tablet 1 tablet (has no administration in time range)  sodium chloride 0.9 % bolus 1,000 mL (0 mLs Intravenous Stopped 10/14/22 1156)  LORazepam (ATIVAN) injection 1 mg (1 mg Intravenous Given 10/14/22 1109)  pantoprazole (PROTONIX) 80 mg /NS 100 mL IVPB (0 mg Intravenous Stopped 10/14/22 1142)  cefTRIAXone (ROCEPHIN) 2 g in sodium chloride 0.9 % 100 mL IVPB (0 g Intravenous Stopped 10/14/22 1132)     IMPRESSION / MDM / ASSESSMENT AND PLAN / ED COURSE  I reviewed the triage vital signs and the nursing notes.   Patient's  presentation is most consistent with acute presentation with potential threat to life or bodily function.   Patient comes in with concerns for GI bleed.  I am mostly concerned for upper GI bleed patient significantly tachycardic given some IV fluids but also concern for withdrawal given her tremors noted in her hands and last drink over 24 hours ago.  Labs ordered evaluate for any Electra abnormalities, anemia, AKI.  Labs showed low hemoglobin.  INR slightly elevated LFTs slightly elevated ultrasound ordered to evaluate for any evidence of cirrhosis, portal vein thrombus.  Cover for possible variceal bleed with octreotide, ceftriaxone as well as give PPI for possible ulcer bleed.  Gust the case with GI Dr. Timothy Lasso  IMPRESSION: 1. Cholelithiasis with mild gallbladder wall thickening and pericholecystic fluid, nonspecific in the setting of ascites. Negative sonographic Murphy sign. No biliary ductal dilatation. Findings are equivocal for acute cholecystitis. Consider further evaluation with HIDA scan if indicated by clinical concern for cholecystitis. 2. Hepatic steatosis. 3. Small volume perihepatic ascites.  Patient is heart rates have come down with fluids and Ativan.  Patient's blood pressures remained stable.  Therefore will admit patient to the hospital team for admission.  The patient is on the cardiac monitor to evaluate for evidence of arrhythmia and/or significant heart rate changes.      FINAL CLINICAL IMPRESSION(S) / ED DIAGNOSES   Final diagnoses:  Gastrointestinal hemorrhage, unspecified gastrointestinal hemorrhage type  Alcohol withdrawal syndrome without complication (HCC)     Rx / DC Orders   ED Discharge Orders     None        Note:  This document was prepared using Dragon voice recognition software and may include unintentional dictation errors.   Concha Se, MD 10/14/22 [phone]

## 2022-10-14 NOTE — ED Notes (Signed)
US at BS

## 2022-10-15 DIAGNOSIS — K922 Gastrointestinal hemorrhage, unspecified: Secondary | ICD-10-CM

## 2022-10-15 DIAGNOSIS — K254 Chronic or unspecified gastric ulcer with hemorrhage: Secondary | ICD-10-CM | POA: Diagnosis not present

## 2022-10-15 DIAGNOSIS — K921 Melena: Secondary | ICD-10-CM | POA: Diagnosis not present

## 2022-10-15 LAB — CBC
HCT: 25.3 % — ABNORMAL LOW (ref 36.0–46.0)
Hemoglobin: 8.7 g/dL — ABNORMAL LOW (ref 12.0–15.0)
MCH: 37.5 pg — ABNORMAL HIGH (ref 26.0–34.0)
MCHC: 34.4 g/dL (ref 30.0–36.0)
MCV: 109.1 fL — ABNORMAL HIGH (ref 80.0–100.0)
Platelets: 84 10*3/uL — ABNORMAL LOW (ref 150–400)
RBC: 2.32 MIL/uL — ABNORMAL LOW (ref 3.87–5.11)
RDW: 18.6 % — ABNORMAL HIGH (ref 11.5–15.5)
WBC: 7 10*3/uL (ref 4.0–10.5)
nRBC: 0.4 % — ABNORMAL HIGH (ref 0.0–0.2)

## 2022-10-15 LAB — HIV ANTIBODY (ROUTINE TESTING W REFLEX): HIV Screen 4th Generation wRfx: NONREACTIVE

## 2022-10-15 LAB — BASIC METABOLIC PANEL
Anion gap: 4 — ABNORMAL LOW (ref 5–15)
BUN: 18 mg/dL (ref 6–20)
CO2: 25 mmol/L (ref 22–32)
Calcium: 7.6 mg/dL — ABNORMAL LOW (ref 8.9–10.3)
Chloride: 111 mmol/L (ref 98–111)
Creatinine, Ser: 0.45 mg/dL (ref 0.44–1.00)
GFR, Estimated: >60 mL/min (ref >60)
Glucose, Bld: 147 mg/dL — ABNORMAL HIGH (ref 70–99)
Potassium: 3.8 mmol/L (ref 3.5–5.1)
Sodium: 140 mmol/L (ref 135–145)

## 2022-10-15 LAB — MAGNESIUM: Magnesium: 2 mg/dL (ref 1.7–2.4)

## 2022-10-15 LAB — GLUCOSE, CAPILLARY: Glucose-Capillary: 126 mg/dL — ABNORMAL HIGH (ref 70–99)

## 2022-10-15 LAB — FOLATE: Folate: 3.9 ng/mL — ABNORMAL LOW (ref >5.9)

## 2022-10-15 MED ORDER — STERILE WATER FOR INJECTION IJ SOLN
INTRAMUSCULAR | Status: AC
  Filled 2022-10-15: qty 10

## 2022-10-15 MED ORDER — FOLIC ACID 1 MG PO TABS: 1.0000 mg | ORAL_TABLET | Freq: Every day | ORAL | 0 refills | Status: AC

## 2022-10-15 MED ORDER — DOCUSATE SODIUM 100 MG PO CAPS: 100.0000 mg | ORAL_CAPSULE | Freq: Two times a day (BID) | ORAL | 2 refills | Status: AC | PRN

## 2022-10-15 MED ORDER — FERROUS SULFATE 325 (65 FE) MG PO TABS: 325.0000 mg | ORAL_TABLET | Freq: Every day | ORAL | 0 refills | Status: DC

## 2022-10-15 MED ORDER — PANTOPRAZOLE SODIUM 40 MG PO TBEC: 40.0000 mg | DELAYED_RELEASE_TABLET | Freq: Two times a day (BID) | ORAL | 0 refills | Status: DC

## 2022-10-15 MED ORDER — LACTULOSE 10 GM/15ML PO SOLN
20.0000 g | Freq: Two times a day (BID) | ORAL | Status: DC
  Administered 2022-10-15: 20 g via ORAL
  Filled 2022-10-15: qty 30

## 2022-10-15 NOTE — Discharge Summary (Signed)
Physician Discharge Summary  Andrea Franco [id number] DOB: December 17, 1985 DOA: 10/14/2022  PCP: Ellene Route  Admit date: 10/14/2022 Discharge date: 10/15/2022  Admitted From: Home Disposition:  Home  Recommendations for Outpatient Follow-up:  Follow up with PCP in 1-2 weeks Please obtain BMP/CBC in one week your next doctors visit.  PPI BID AC x 8 weeks. Follow up outpatient GI and will need repeat EGD Folate and Iron Supp with bowel regimen prescribed Counseled to quite drinking alcohol.  Outpatient Gen Surgery follow up  Home Health: None Equipment/Devices:None Discharge Condition: Stable CODE STATUS: Full Code Diet recommendation: Soft diet  Brief/Interim Summary: 36 y.o. female with medical history significant of daily alcohol use comes to the hospital with complaints of abdominal discomfort, bloating and dark stools. In the ED she was noted to have a hemoglobin of 8.7, baseline used to be 14, hypokalemia, transaminitis, mildly elevated lipase. EGD on 11/11 showed Duodenitis, Non bleeding duodenal ulcer & Gastric ulcer, and Esophagitis. GI recommended PPI IV for 48 hrs in the hospital, then PO BID for 8 weeks with outpatient GI follow up. RUQ [place] also showed gallbladder wall thickening, and she was seen by Gen Surgery who recommended monitoring for now. But if her symptoms worsens we can eventually get HIDA scan.     Doing well this morning, no complaints. No further signs of bleeding. Cleared for Dc. I went over her instructions in detail with the patient and her husband at bedside on the day of discharge.    Acute blood loss anemia, upper GI bleed Macrocytosis -Hb is 8.7. s/p 1U PRBC tx.    EGD on 11/11 showed Duodenitis, Non bleeding duodenal ulcer & Gastric ulcer, and Esophagitis. GI recommended PPI IV BID, will change to PO today which she will need for 8 weeks then likely repeat EGD. TSH & B12 normal. Low Folate.    Alcohol use Alcoholic hepatitis; Elevated  Lipase.  - Has been drinking at least 1 bottle of wine for past 10 years.  Not showing signs of withdrawal. Counseled to quite using it.    Lactic acidosis - received IVF.    Gallstones with gallbladder distention concerns for acute cholecystitis - This is seen on right upper quadrant ultrasound.  Seen by gen surgery. who recommended monitoring for now. But if her symptoms worsens we can eventually get HIDA scan. Will follow up outpatient with Gen surgery   Hypokalemia - As needed repletion    Folate Def Supplements ordered   Elevated Ammonia No confusion, received lactulose.         Discharge Diagnoses:  Principal Problem:   GI bleed Active Problems:   Alcohol abuse   Hypokalemia   Transaminitis   Calculus of gallbladder without cholecystitis without obstruction      Consultations: GI Surgery  Subjective: Feels ok, no complaints. No further signs of bleeding Husband at bedside.   Discharge Exam: Vitals:   10/15/22 0049 10/15/22 0454  BP: (!) 131/93 (!) 137/98  Pulse: 88 83  Resp: 18 18  Temp: 98.5 F (36.9 C) 98.6 F (37 C)  SpO2: 96% 97%   Vitals:   10/14/22 1645 10/14/22 1932 10/15/22 0049 10/15/22 0454  BP: (!) 153/96 (!) 156/107 (!) 131/93 (!) 137/98  Pulse: 94 91 88 83  Resp:  18 18 18   Temp: 98 F (36.7 C) 98.5 F (36.9 C) 98.5 F (36.9 C) 98.6 F (37 C)  TempSrc: Temporal Oral Oral Oral  SpO2: 98% 100% 96% 97%  Weight:  Height:        General: Pt is alert, awake, not in acute distress Cardiovascular: RRR, S1/S2 +, no rubs, no gallops Respiratory: CTA bilaterally, no wheezing, no rhonchi Abdominal: Soft, NT, ND, bowel sounds + Extremities: no edema, no cyanosis  Discharge Instructions   Allergies as of 10/15/2022   No Known Allergies      Medication List     TAKE these medications    docusate sodium 100 MG capsule Commonly known as: Colace Take 1 capsule (100 mg total) by mouth 2 (two) times daily as needed for  moderate constipation.   ferrous sulfate 325 (65 FE) MG tablet Take 1 tablet (325 mg total) by mouth daily.   folic acid 1 MG tablet Commonly known as: FOLVITE Take 1 tablet (1 mg total) by mouth daily.   pantoprazole 40 MG tablet Commonly known as: Protonix Take 1 tablet (40 mg total) by mouth 2 (two) times daily before a meal.        Follow-up Information     Olean Ree, MD Follow up.   Specialty: General Surgery Why: As needed Contact information: [address] [place] [address] [phone]                No Known Allergies  You were cared for by a hospitalist during your hospital stay. If you have any questions about your discharge medications or the care you received while you were in the hospital after you are discharged, you can call the unit and asked to speak with the hospitalist on call if the hospitalist that took care of you is not available. Once you are discharged, your primary care physician will handle any further medical issues. Please note that no refills for any discharge medications will be authorized once you are discharged, as it is imperative that you return to your primary care physician (or establish a relationship with a primary care physician if you do not have one) for your aftercare needs so that they can reassess your need for medications and monitor your lab values.   Procedures/Studies: US ABDOMEN LIMITED RUQ (LIVER/GB)  Result Date: 10/14/2022 CLINICAL DATA:  Right upper quadrant pain for 2 weeks EXAM: ULTRASOUND ABDOMEN LIMITED RIGHT UPPER QUADRANT COMPARISON:  None Available. FINDINGS: Gallbladder: Gallstones. Mild gallbladder wall thickening. Minimal pericholecystic fluid. No sonographic Murphy sign noted by sonographer. Common bile duct: Diameter: 0.4 cm. Liver: No focal lesion identified. Increased parenchymal echogenicity. Portal vein is patent on color Doppler imaging with normal direction of blood flow  towards the liver. Other: Small volume perihepatic ascites. IMPRESSION: 1. Cholelithiasis with mild gallbladder wall thickening and pericholecystic fluid, nonspecific in the setting of ascites. Negative sonographic Murphy sign. No biliary ductal dilatation. Findings are equivocal for acute cholecystitis. Consider further evaluation with HIDA scan if indicated by clinical concern for cholecystitis. 2. Hepatic steatosis. 3. Small volume perihepatic ascites. Electronically Signed   By: Delanna Ahmadi M.D.   On: 10/14/2022 11:51   DG Chest Portable 1 View  Result Date: 10/14/2022 CLINICAL DATA:  Abdominal bloating for 2 weeks EXAM: PORTABLE CHEST 1 VIEW COMPARISON:  None Available. FINDINGS: The heart size and mediastinal contours are within normal limits. No focal pulmonary opacity. No pleural effusion or pneumothorax. The visualized upper abdomen is unremarkable. No acute osseous abnormality. IMPRESSION: No acute cardiopulmonary abnormality. Electronically Signed   By: Beryle Flock M.D.   On: 10/14/2022 10:53     The results of significant diagnostics from this hospitalization (including imaging,  microbiology, ancillary and laboratory) are listed below for reference.     Microbiology: Recent Results (from the past 240 hour(s))  Resp Panel by RT-PCR (Flu A&B, Covid) Anterior Nasal Swab     Status: None   Collection Time: 10/14/22 11:19 AM   Specimen: Anterior Nasal Swab  Result Value Ref Range Status   SARS Coronavirus 2 by RT PCR NEGATIVE NEGATIVE Final    Comment: (NOTE) SARS-CoV-2 target nucleic acids are NOT DETECTED.  The SARS-CoV-2 RNA is generally detectable in upper respiratory specimens during the acute phase of infection. The lowest concentration of SARS-CoV-2 viral copies this assay can detect is 138 copies/mL. A negative result does not preclude SARS-Cov-2 infection and should not be used as the sole basis for treatment or other patient management decisions. A negative result  may occur with  improper specimen collection/handling, submission of specimen other than nasopharyngeal swab, presence of viral mutation(s) within the areas targeted by this assay, and inadequate number of viral copies(<138 copies/mL). A negative result must be combined with clinical observations, patient history, and epidemiological information. The expected result is Negative.  Fact Sheet for Patients:  EntrepreneurPulse.com.au  Fact Sheet for Healthcare Providers:  IncredibleEmployment.be  This test is no t yet approved or cleared by the [place] FDA and  has been authorized for detection and/or diagnosis of SARS-CoV-2 by FDA under an Emergency Use Authorization (EUA). This EUA will remain  in effect (meaning this test can be used) for the duration of the COVID-19 declaration under Section 564(b)(1) of the Act, 21 U.S.C.section 360bbb-3(b)(1), unless the authorization is terminated  or revoked sooner.       Influenza A by PCR NEGATIVE NEGATIVE Final   Influenza B by PCR NEGATIVE NEGATIVE Final    Comment: (NOTE) The Xpert Xpress SARS-CoV-2/FLU/RSV plus assay is intended as an aid in the diagnosis of influenza from Nasopharyngeal swab specimens and should not be used as a sole basis for treatment. Nasal washings and aspirates are unacceptable for Xpert Xpress SARS-CoV-2/FLU/RSV testing.  Fact Sheet for Patients: EntrepreneurPulse.com.au  Fact Sheet for Healthcare Providers: IncredibleEmployment.be  This test is not yet approved or cleared by the [place] FDA and has been authorized for detection and/or diagnosis of SARS-CoV-2 by FDA under an Emergency Use Authorization (EUA). This EUA will remain in effect (meaning this test can be used) for the duration of the COVID-19 declaration under Section 564(b)(1) of the Act, 21 U.S.C. section 360bbb-3(b)(1), unless the authorization is terminated  or revoked.  Performed at [facility], [address] [place], [place] [address]   Blood culture (routine x 2)     Status: None (Preliminary result)   Collection Time: 10/14/22 11:19 AM   Specimen: Right Antecubital; Blood  Result Value Ref Range Status   Specimen Description RIGHT ANTECUBITAL  Final   Special Requests   Final    BOTTLES DRAWN AEROBIC AND ANAEROBIC Blood Culture adequate volume   Culture   Final    NO GROWTH < 24 HOURS Performed at [facility], [address] [place], [place] [address]    Report Status PENDING  Incomplete  Blood culture (routine x 2)     Status: None (Preliminary result)   Collection Time: 10/14/22 11:19 AM   Specimen: BLOOD  Result Value Ref Range Status   Specimen Description BLOOD BLOOD RIGHT ARM  Final   Special Requests   Final    BOTTLES DRAWN AEROBIC AND ANAEROBIC Blood Culture adequate volume   Culture  Final    NO GROWTH < 24 HOURS Performed at [facility], [address] [place], [place] [address]    Report Status PENDING  Incomplete     Labs: BNP (last 3 results) No results for input(s): "BNP" in the last 8760 hours. Basic Metabolic Panel: Recent Labs  Lab 10/14/22 1005 10/15/22 0536  NA 137 140  K 3.1* 3.8  CL 97* 111  CO2 29 25  GLUCOSE 170* 147*  BUN 25* 18  CREATININE 0.61 0.45  CALCIUM 8.4* 7.6*  MG  --  2.0   Liver Function Tests: Recent Labs  Lab 10/14/22 1005  AST 53*  ALT 22  ALKPHOS 145*  BILITOT 2.4*  PROT 6.5  ALBUMIN 3.0*   Recent Labs  Lab 10/14/22 1005  LIPASE 148*   Recent Labs  Lab 10/14/22 1119  AMMONIA 36*   CBC: Recent Labs  Lab 10/14/22 1005 10/15/22 0536  WBC 14.7* 7.0  HGB 8.7* 8.7*  HCT 26.7* 25.3*  MCV 118.7* 109.1*  PLT 130* 84*   Cardiac Enzymes: No results for input(s): "CKTOTAL", "CKMB", "CKMBINDEX", "TROPONINI" in the last 168 hours. BNP: Invalid input(s): "POCBNP" CBG: Recent Labs  Lab 10/15/22 0605  GLUCAP 126*    D-Dimer No results for input(s): "DDIMER" in the last 72 hours. Hgb A1c No results for input(s): "HGBA1C" in the last 72 hours. Lipid Profile No results for input(s): "CHOL", "HDL", "LDLCALC", "TRIG", "CHOLHDL", "LDLDIRECT" in the last 72 hours. Thyroid function studies Recent Labs    10/14/22 1119  TSH 2.396   Anemia work up Recent Labs    10/14/22 1119 10/14/22 1805 10/15/22 0536  VITAMINB12  --  221  --   FOLATE  --   --  3.9*  FERRITIN 389*  --   --   TIBC 225*  --   --   IRON 119  --   --    Urinalysis    Component Value Date/Time   COLORURINE AMBER (A) 10/14/2022 1005   APPEARANCEUR HAZY (A) 10/14/2022 1005   LABSPEC 1.020 10/14/2022 1005   PHURINE 5.0 10/14/2022 1005   GLUCOSEU NEGATIVE 10/14/2022 1005   HGBUR NEGATIVE 10/14/2022 1005   BILIRUBINUR NEGATIVE 10/14/2022 1005   KETONESUR 5 (A) 10/14/2022 1005   PROTEINUR NEGATIVE 10/14/2022 1005   NITRITE NEGATIVE 10/14/2022 1005   LEUKOCYTESUR NEGATIVE 10/14/2022 1005   Sepsis Labs Recent Labs  Lab 10/14/22 1005 10/15/22 0536  WBC 14.7* 7.0   Microbiology Recent Results (from the past 240 hour(s))  Resp Panel by RT-PCR (Flu A&B, Covid) Anterior Nasal Swab     Status: None   Collection Time: 10/14/22 11:19 AM   Specimen: Anterior Nasal Swab  Result Value Ref Range Status   SARS Coronavirus 2 by RT PCR NEGATIVE NEGATIVE Final    Comment: (NOTE) SARS-CoV-2 target nucleic acids are NOT DETECTED.  The SARS-CoV-2 RNA is generally detectable in upper respiratory specimens during the acute phase of infection. The lowest concentration of SARS-CoV-2 viral copies this assay can detect is 138 copies/mL. A negative result does not preclude SARS-Cov-2 infection and should not be used as the sole basis for treatment or other patient management decisions. A negative result may occur with  improper specimen collection/handling, submission of specimen other than nasopharyngeal swab, presence of viral mutation(s)  within the areas targeted by this assay, and inadequate number of viral copies(<138 copies/mL). A negative result must be combined with clinical observations, patient history, and epidemiological information. The expected result is Negative.  Fact Sheet for Patients:  EntrepreneurPulse.com.au  Fact Sheet for Healthcare Providers:  IncredibleEmployment.be  This test is no t yet approved or cleared by the [place] FDA and  has been authorized for detection and/or diagnosis of SARS-CoV-2 by FDA under an Emergency Use Authorization (EUA). This EUA will remain  in effect (meaning this test can be used) for the duration of the COVID-19 declaration under Section 564(b)(1) of the Act, 21 U.S.C.section 360bbb-3(b)(1), unless the authorization is terminated  or revoked sooner.       Influenza A by PCR NEGATIVE NEGATIVE Final   Influenza B by PCR NEGATIVE NEGATIVE Final    Comment: (NOTE) The Xpert Xpress SARS-CoV-2/FLU/RSV plus assay is intended as an aid in the diagnosis of influenza from Nasopharyngeal swab specimens and should not be used as a sole basis for treatment. Nasal washings and aspirates are unacceptable for Xpert Xpress SARS-CoV-2/FLU/RSV testing.  Fact Sheet for Patients: EntrepreneurPulse.com.au  Fact Sheet for Healthcare Providers: IncredibleEmployment.be  This test is not yet approved or cleared by the [place] FDA and has been authorized for detection and/or diagnosis of SARS-CoV-2 by FDA under an Emergency Use Authorization (EUA). This EUA will remain in effect (meaning this test can be used) for the duration of the COVID-19 declaration under Section 564(b)(1) of the Act, 21 U.S.C. section 360bbb-3(b)(1), unless the authorization is terminated or revoked.  Performed at [facility], [address] [place], [place] [address]   Blood culture (routine x 2)     Status: None  (Preliminary result)   Collection Time: 10/14/22 11:19 AM   Specimen: Right Antecubital; Blood  Result Value Ref Range Status   Specimen Description RIGHT ANTECUBITAL  Final   Special Requests   Final    BOTTLES DRAWN AEROBIC AND ANAEROBIC Blood Culture adequate volume   Culture   Final    NO GROWTH < 24 HOURS Performed at [facility], [address], [place], [place] [address]    Report Status PENDING  Incomplete  Blood culture (routine x 2)     Status: None (Preliminary result)   Collection Time: 10/14/22 11:19 AM   Specimen: BLOOD  Result Value Ref Range Status   Specimen Description BLOOD BLOOD RIGHT ARM  Final   Special Requests   Final    BOTTLES DRAWN AEROBIC AND ANAEROBIC Blood Culture adequate volume   Culture   Final    NO GROWTH < 24 HOURS Performed at [facility], [address], [place], [place] [address]    Report Status PENDING  Incomplete     Time coordinating discharge:  I have spent 35 minutes face to face with the patient and on the ward discussing the patients care, assessment, plan and disposition with other care givers. >50% of the time was devoted counseling the patient about the risks and benefits of treatment/Discharge disposition and coordinating care.   SIGNED:   Damita Lack, MD  [facility] 10/15/2022, 10:56 AM   If 7PM-7AM, please contact night-coverage

## 2022-10-15 NOTE — TOC Initial Note (Signed)
Transition of Care [facility]) - Initial/Assessment Note    Patient Details  Name: Andrea Franco MRN: [id number] Date of Birth: July 21, 1986  [facility] CM/SW Contact:    Kemper Durie, RN Phone Number: 10/15/2022, 11:13 AM  Clinical Narrative:                  Spoke with patient and husband at the bedside, with who she resides.  PCP is at [facility], uses [facility] for medications.  Husband will transport home when ready for discharge.  Receptive to substance abuse resources for alcohol abuse, information and education provided at bedside.  They are concerned about cost of hospital stay and are interested in seeing if patient is eligible for emergency Medicaid.  Provided with financial assistance application and phone number to contact billing office tomorrow.    Expected Discharge Plan: Home/Self Care Barriers to Discharge: Continued Medical Work up   Patient Goals and CMS Choice Patient states their goals for this hospitalization and ongoing recovery are:: Home      Expected Discharge Plan and Services Expected Discharge Plan: Home/Self Care       Living arrangements for the past 2 months: Single Family Home Expected Discharge Date: 10/15/22                                    Prior Living Arrangements/Services Living arrangements for the past 2 months: Single Family Home Lives with:: Spouse Patient language and need for interpreter reviewed:: Yes Do you feel safe going back to the place where you live?: Yes      Need for Family Participation in Patient Care: No (Comment) Care giver support system in place?: Yes (comment)   Criminal Activity/Legal Involvement Pertinent to Current Situation/Hospitalization: No - Comment as needed  Activities of Daily Living Home Assistive Devices/Equipment: None ADL Screening (condition at time of admission) Patient's cognitive ability adequate to safely complete daily activities?: Yes Is the patient  deaf or have difficulty hearing?: No Does the patient have difficulty seeing, even when wearing glasses/contacts?: No Does the patient have difficulty concentrating, remembering, or making decisions?: No Patient able to express need for assistance with ADLs?: Yes Does the patient have difficulty dressing or bathing?: No Independently performs ADLs?: Yes (appropriate for developmental age) Does the patient have difficulty walking or climbing stairs?: No Weakness of Legs: None Weakness of Arms/Hands: None  Permission Sought/Granted                  Emotional Assessment Appearance:: Appears stated age Attitude/Demeanor/Rapport: Engaged Affect (typically observed): Calm Orientation: : Oriented to Self, Oriented to Place, Oriented to  Time, Oriented to Situation Alcohol / Substance Use: Alcohol Use Psych Involvement: No (comment)  Admission diagnosis:  GI bleed [K92.2] Alcohol withdrawal syndrome without complication (HCC) [F10.930] Gastrointestinal hemorrhage, unspecified gastrointestinal hemorrhage type [K92.2] Patient Active Problem List   Diagnosis Date Noted   GI bleed 10/14/2022   Alcohol abuse 10/14/2022   Hypokalemia 10/14/2022   Transaminitis 10/14/2022   Calculus of gallbladder without cholecystitis without obstruction 10/14/2022   PCP:  Nira Retort Pharmacy:   [id number] [place], [place] - [address] AT [facility] [address] [place] [address] Phone: [phone] Fax: [phone]     Social Determinants of Health (SDOH) Interventions    Readmission Risk Interventions     No data  to display

## 2022-10-15 NOTE — Progress Notes (Signed)
10/15/2022  Subjective: No acute events overnight.  Patient was started on a full liquid diet and she tolerated this well.  Denies any abdominal pain, nausea, vomiting.  Hemoglobin is stable at 8.7 today.  Vital signs: Temp:  [98 F (36.7 C)-98.6 F (37 C)] 98.5 F (36.9 C) (11/12 1139) Pulse Rate:  [74-94] 74 (11/12 1139) Resp:  [16-18] 16 (11/12 1139) BP: (131-156)/(93-107) 143/95 (11/12 1139) SpO2:  [96 %-100 %] 100 % (11/12 1139)   Intake/Output: 11/11 0701 - 11/12 0700 In: 3338.7 [I.V.:1718.7; Blood:420; IV Piggyback:1200] Out: -     Physical Exam: Constitutional: No acute distress Abdomen: Soft, nondistended, nontender to palpation.  Labs:  Recent Labs    10/14/22 1005 10/15/22 0536  WBC 14.7* 7.0  HGB 8.7* 8.7*  HCT 26.7* 25.3*  PLT 130* 84*   Recent Labs    10/14/22 1005 10/15/22 0536  NA 137 140  K 3.1* 3.8  CL 97* 111  CO2 29 25  GLUCOSE 170* 147*  BUN 25* 18  CREATININE 0.61 0.45  CALCIUM 8.4* 7.6*   Recent Labs    10/14/22 1119  LABPROT 17.9*  INR 1.5*    Imaging: No results found.  Assessment/Plan: This is a 36 y.o. female with GI bleed from multiple gastric and duodenal ulcerations.  - Patient is doing very well without any further pain or symptoms.  Hemoglobin is stable.  She tolerated a full liquid diet. - Patient has been advanced to a soft diet today. - No acute surgical needs.  If she tolerates a soft diet, she is okay to discharge home from surgery standpoint. - Discussed with the patient that most likely the cholelithiasis noted on ultrasound was incidental finding and the most likely culprit for the pain issues that she was having as the ulcerations.  However discussed with patient that if after her treatment is completed for her ulcers, and there is still some residual symptoms of discomfort, she can contact us so that we can reevaluate her to see if her cholelithiasis is actually symptomatic or not. - Follow-up with [place] as  needed.   I spent 35 minutes dedicated to the care of this patient on the date of this encounter to include pre-visit review of records, face-to-face time with the patient discussing diagnosis and management, and any post-visit coordination of care.  Howie Ill, MD [facility]

## 2022-10-16 ENCOUNTER — Encounter: Payer: Self-pay | Admitting: Gastroenterology

## 2022-10-16 LAB — HEPATITIS PANEL, ACUTE
HCV Ab: NONREACTIVE
Hep A IgM: NONREACTIVE
Hep B C IgM: NONREACTIVE
Hepatitis B Surface Ag: NONREACTIVE

## 2022-10-17 LAB — BPAM RBC
Blood Product Expiration Date: 202312082359
Blood Product Expiration Date: 202312082359
ISSUE DATE / TIME: 202311111341
Unit Type and Rh: 6200
Unit Type and Rh: 6200

## 2022-10-17 LAB — TYPE AND SCREEN
ABO/RH(D): A POS
Antibody Screen: NEGATIVE
Unit division: 0
Unit division: 0

## 2022-10-17 LAB — PREPARE RBC (CROSSMATCH)

## 2022-10-19 LAB — CULTURE, BLOOD (ROUTINE X 2)
Culture: NO GROWTH
Culture: NO GROWTH
Special Requests: ADEQUATE
Special Requests: ADEQUATE

## 2023-10-28 ENCOUNTER — Other Ambulatory Visit: Payer: Self-pay

## 2023-10-28 ENCOUNTER — Emergency Department
Admission: EM | Admit: 2023-10-28 | Discharge: 2023-10-28 | Disposition: A | Discharge status: Home / Self Care | Payer: BLUE CROSS/BLUE SHIELD | Attending: Emergency Medicine | Admitting: Emergency Medicine

## 2023-10-28 DIAGNOSIS — I10 Essential (primary) hypertension: Secondary | ICD-10-CM | POA: Insufficient documentation

## 2023-10-28 DIAGNOSIS — R040 Epistaxis: Secondary | ICD-10-CM | POA: Diagnosis present

## 2023-10-28 LAB — CBC
HCT: 38.7 % (ref 36.0–46.0)
Hemoglobin: 13.3 g/dL (ref 12.0–15.0)
MCH: 37.3 pg — ABNORMAL HIGH (ref 26.0–34.0)
MCHC: 34.4 g/dL (ref 30.0–36.0)
MCV: 108.4 fL — ABNORMAL HIGH (ref 80.0–100.0)
Platelets: 60 10*3/uL — ABNORMAL LOW (ref 150–400)
RBC: 3.57 MIL/uL — ABNORMAL LOW (ref 3.87–5.11)
RDW: 15.4 % (ref 11.5–15.5)
WBC: 7.5 10*3/uL (ref 4.0–10.5)
nRBC: 0 % (ref 0.0–0.2)

## 2023-10-28 LAB — COMPREHENSIVE METABOLIC PANEL
ALT: 27 U/L (ref 0–44)
AST: 114 U/L — ABNORMAL HIGH (ref 15–41)
Albumin: 3.8 g/dL (ref 3.5–5.0)
Alkaline Phosphatase: 142 U/L — ABNORMAL HIGH (ref 38–126)
Anion gap: 12 (ref 5–15)
BUN: <5 mg/dL — ABNORMAL LOW (ref 6–20)
CO2: 21 mmol/L — ABNORMAL LOW (ref 22–32)
Calcium: 8.2 mg/dL — ABNORMAL LOW (ref 8.9–10.3)
Chloride: 102 mmol/L (ref 98–111)
Creatinine, Ser: <0.3 mg/dL — ABNORMAL LOW (ref 0.44–1.00)
Glucose, Bld: 124 mg/dL — ABNORMAL HIGH (ref 70–99)
Potassium: 4 mmol/L (ref 3.5–5.1)
Sodium: 135 mmol/L (ref 135–145)
Total Bilirubin: 2.1 mg/dL — ABNORMAL HIGH (ref <1.2)
Total Protein: 8.3 g/dL — ABNORMAL HIGH (ref 6.5–8.1)

## 2023-10-28 MED ORDER — SILVER NITRATE-POT NITRATE 75-25 % EX MISC
1.0000 | Freq: Once | CUTANEOUS | Status: AC
  Administered 2023-10-28: 1 via TOPICAL
  Filled 2023-10-28: qty 10

## 2023-10-28 MED ORDER — LABETALOL HCL 5 MG/ML IV SOLN
10.0000 mg | Freq: Once | INTRAVENOUS | Status: AC
  Administered 2023-10-28: 10 mg via INTRAVENOUS
  Filled 2023-10-28: qty 4

## 2023-10-28 MED ORDER — LOSARTAN POTASSIUM 50 MG PO TABS
50.0000 mg | ORAL_TABLET | Freq: Once | ORAL | Status: AC
  Administered 2023-10-28: 50 mg via ORAL
  Filled 2023-10-28: qty 1

## 2023-10-28 MED ORDER — OXYMETAZOLINE HCL 0.05 % NA SOLN
1.0000 | Freq: Once | NASAL | Status: AC
  Administered 2023-10-28: 1 via NASAL
  Filled 2023-10-28: qty 30

## 2023-10-28 MED ORDER — LOSARTAN POTASSIUM 25 MG PO TABS: 25.0000 mg | ORAL_TABLET | Freq: Every day | ORAL | 11 refills | Status: DC

## 2023-10-28 NOTE — ED Triage Notes (Addendum)
Pt to ed from home via POV for a nose bleed in only the right nostril that started about 2 hours ago. Pt is caox4, in no acute distress and ambulatory in triage. Pt has no significant med hx and takes no blood thinners. Pt states "I have high bp". When asked what meds she takes, pt states "I do not take any meds" pt pulled herself off of her BP meds a few years ago. Pt is very shaky. Pt advised she is very nervous.

## 2023-10-28 NOTE — Discharge Instructions (Addendum)
Please check your blood pressure for the next 3-5 days in the morning.  If your top number (systolic) is greater than 150 or if the bottom number (diastolic) is greater than 90, please take the prescribed losartan

## 2023-10-28 NOTE — ED Provider Notes (Signed)
[facility] Provider Note   Event Date/Time   First MD Initiated Contact with Patient 10/28/23 1735     (approximate) History  Epistaxis (X 2 hours)  HPI Andrea Franco is a 37 y.o. female with a stated past medical history of hypertension not taking medications who presents for epistaxis.  Patient states that this started 2 hours prior to arrival and states that this normally becomes a problem as the weather gets cold.  Patient is not tried any medications at home.  Of ROS: Patient currently denies any vision changes, tinnitus, difficulty speaking, facial droop, sore throat, chest pain, shortness of breath, abdominal pain, nausea/vomiting/diarrhea, dysuria, or weakness/numbness/paresthesias in any extremity   Physical Exam  Triage Vital Signs: ED Triage Vitals  Encounter Vitals Group     BP 10/28/23 1553 (!) 188/108     Systolic BP Percentile --      Diastolic BP Percentile --      Pulse Rate 10/28/23 1553 (!) 133     Resp 10/28/23 1553 20     Temp 10/28/23 1553 97.8 F (36.6 C)     Temp src --      SpO2 10/28/23 1553 98 %     Weight --      Height 10/28/23 1551 5\' 2"  (1.575 m)     Head Circumference --      Peak Flow --      Pain Score 10/28/23 1551 0     Pain Loc --      Pain Education --      Exclude from Growth Chart --    Most recent vital signs: Vitals:   10/28/23 1800 10/28/23 1815  BP: (!) 169/116 (!) 136/94  Pulse: (!) 115 98  Resp: 18   Temp:    SpO2: 99% 99%   General: Awake, oriented x4. CV:  Good peripheral perfusion.  Resp:  Normal effort.  Abd:  No distention.  Other:  Middle-aged well-developed, well-nourished Caucasian female resting comfortably in no acute distress.  Active bleeding at the medial aspect of the right nare cauterized with silver nitrate at bedside ED Results / Procedures / Treatments  Labs (all labs ordered are listed, but only abnormal results are displayed) Labs Reviewed  CBC - Abnormal; Notable for the  following components:      Result Value   RBC 3.57 (*)    MCV 108.4 (*)    MCH 37.3 (*)    Platelets 60 (*)    All other components within normal limits  COMPREHENSIVE METABOLIC PANEL - Abnormal; Notable for the following components:   CO2 21 (*)    Glucose, Bld 124 (*)    BUN <5 (*)    Creatinine, Ser <0.30 (*)    Calcium 8.2 (*)    Total Protein 8.3 (*)    AST 114 (*)    Alkaline Phosphatase 142 (*)    Total Bilirubin 2.1 (*)    All other components within normal limits   PROCEDURES: Critical Care performed: No Procedures MEDICATIONS ORDERED IN ED: Medications  oxymetazoline (AFRIN) 0.05 % nasal spray 1 spray (1 spray Each Nare Given 10/28/23 1558)  labetalol (NORMODYNE) injection 10 mg (10 mg Intravenous Given 10/28/23 1803)  losartan (COZAAR) tablet 50 mg (50 mg Oral Given 10/28/23 1848)  silver nitrate applicators applicator 1 Application (1 Application Topical Given by Other 10/28/23 1950)   IMPRESSION / MDM / ASSESSMENT AND PLAN / ED COURSE  I reviewed the triage vital signs and  the nursing notes.                             The patient is on the cardiac monitor to evaluate for evidence of arrhythmia and/or significant heart rate changes. Patient's presentation is most consistent with acute presentation with potential threat to life or bodily function. The bleeding source appears to be anterior in origin. Blood clots were cleared by having the patient blow them out into a towel. After this, a nasal clamp was applied for about 10 minutes. Upon removing the nasal clamp, the bleeding stopped however quickly returned and was eventually cauterized with silver nitrate.  The patient has been reexamined and is ready to be discharged.  All diagnostic results have been reviewed and discussed with the patient/family.  Care plan has been outlined and the patient/family understands all current diagnoses, results, and treatment plans.  There are no new complaints, changes, or physical  findings at this time.  All questions have been addressed and answered.   Patient was instructed to, and agrees to follow-up with their primary care physician as well as return to the emergency department if any new or worsening symptoms develop.   FINAL CLINICAL IMPRESSION(S) / ED DIAGNOSES   Final diagnoses:  Anterior epistaxis   Rx / DC Orders   ED Discharge Orders          Ordered    losartan (COZAAR) 25 MG tablet  Daily        10/28/23 1942           Note:  This document was prepared using Dragon voice recognition software and may include unintentional dictation errors.   Merwyn Katos, MD 10/28/23 [phone]

## 2024-08-28 ENCOUNTER — Ambulatory Visit: Payer: Self-pay

## 2024-08-28 NOTE — Telephone Encounter (Signed)
 FYI Only or Action Required?: Action required by provider: request for appointment.  Patient was last seen in primary care on Andrea Franco.  Called Nurse Triage reporting Hypertension.  Symptoms began several years ago.  Interventions attempted: Prescription medications: Losartan .  Symptoms are: stable.  Triage Disposition: Home Care  Patient/caregiver understands and will follow disposition?: Yes  Copied from CRM #[id number]. Topic: Clinical - Red Word Triage >> Aug 28, 2024 12:49 PM Andrea Franco wrote: Red Word that prompted transfer to Nurse Triage: Pt needs bp med refil needs physical New pt establish care. Frequent nose bleed feet and hand tingling. Red spots on chest and back does not itch and has spread Reason for Disposition  Systolic BP < 120 with Diastolic < 80  Answer Assessment - Initial Assessment Questions New pt appt scheduled 09/08/24. Advised UC/ED if symptoms worsen.  Last nose bleed yesterday, 30 minutes, it was manageable, Denies, faint, dizziness, taking blood thinners; onset nosebleeds since last November 2024.  Blood transfusion, November 2023, since then been getting red blotches, not raised, don't itch, not painful, it's on chest and back.  1. BLOOD PRESSURE: What is your blood pressure? Did you take at least two measurements 5 minutes apart?     BP:119/77 took yesterday; the readings have been around this since taking BP med Losartan . 2. ONSET: When did you take your blood pressure?     yesterday 3. HOW: How did you take your blood pressure? (e.g., automatic home BP monitor, visiting nurse)     Auto BP cuff, arm 4. HISTORY: Do you have a history of high blood pressure?     HTN 5. MEDICINES: Are you taking any medicines for blood pressure? Have you missed any doses recently?     Losartan ; have not missed any doses; have at least 1 1/2 week worth of pills left 6. OTHER SYMPTOMS: Do you have any symptoms? (e.g., blurred vision, chest pain, difficulty  breathing, headache, weakness)    denies HA, dizziness, faint, diff breathing, chest pain, abd pain. Bilateral numbness/tingling from standing a lot at work.denies swollen, discolored, pain 7. PREGNANCY: Is there any chance you are pregnant? When was your last menstrual period?     no  Protocols used: Blood Pressure - High-A-AH

## 2024-09-08 ENCOUNTER — Ambulatory Visit (INDEPENDENT_AMBULATORY_CARE_PROVIDER_SITE_OTHER): Payer: Self-pay

## 2024-09-08 VITALS — BP 140/60 | HR 106 | Ht 64.0 in | Wt 131.4 lb

## 2024-09-08 DIAGNOSIS — F17200 Nicotine dependence, unspecified, uncomplicated: Secondary | ICD-10-CM

## 2024-09-08 DIAGNOSIS — F1010 Alcohol abuse, uncomplicated: Secondary | ICD-10-CM | POA: Diagnosis not present

## 2024-09-08 DIAGNOSIS — R040 Epistaxis: Secondary | ICD-10-CM | POA: Insufficient documentation

## 2024-09-08 DIAGNOSIS — K760 Fatty (change of) liver, not elsewhere classified: Secondary | ICD-10-CM

## 2024-09-08 DIAGNOSIS — R000 Tachycardia, unspecified: Secondary | ICD-10-CM | POA: Diagnosis not present

## 2024-09-08 DIAGNOSIS — Z23 Encounter for immunization: Secondary | ICD-10-CM

## 2024-09-08 DIAGNOSIS — R233 Spontaneous ecchymoses: Secondary | ICD-10-CM

## 2024-09-08 DIAGNOSIS — D696 Thrombocytopenia, unspecified: Secondary | ICD-10-CM | POA: Insufficient documentation

## 2024-09-08 HISTORY — DX: Epistaxis: R04.0

## 2024-09-08 NOTE — Progress Notes (Signed)
 New Patient Visit   Physician: Normajean Nash A Jamauri Kruzel, MD  Patient: Andrea Franco   DOB: February 08, 1986   38 y.o. Female  MRN: [id number] Visit Date: 09/08/2024   Chief Complaint  Patient presents with   Establish Care     Patient is Concerned about her blood pressure needs her medication. Numbness in her feet,started 1 year ago.   Subjective  Andrea Franco is a 38 y.o. female who presents today as a new patient to establish care.   HPI  Discussed the use of AI scribe software for clinical note transcription with the patient, who gave verbal consent to proceed.  History of Present Illness   Andrea Franco is a 38 year old female with hypertension who presents with concerns about blood pressure management and recurrent epistaxis.  Hypertension  - Takes losartan  25 mg daily for blood pressure control - Home blood pressure readings typically around 108/65 mmHg - Elevated blood pressure readings in clinical settings attributed to anxiety - No dizziness or lightheadedness  Recurrent epistaxis and bleeding tendencies - Recurrent epistaxis since November of last year - Significant episode of epistaxis required emergency room intervention due to high blood pressure - Easy bruising and gingival bleeding, especially when brushing teeth - No hematuria or hematochezia - History of hospitalization two years ago for bleeding ulcer with hematemesis (vomiting black material) and dizziness - Received blood transfusion during hospitalization for bleeding ulcer  - Noted thrombocytopenia in 2023 with platelets of 60k.  Patient lost to follow up  Peripheral neuropathy symptoms - Numbness in feet for approximately one year - Impaired ability to feel pedals while driving - Numbness is non-painful and localized to feet, not spreading  Menstrual irregularity - Amenorrhea for approximately two years - Not using birth control  Balance disturbance - Change in balance with unsteadiness when climbing  ladders  Dermatologic symptoms - Rash onset approximately two years ago, coinciding with blood transfusion - Rash has progressively worsened and spread to hands   ETOH use - History of ETOH abuse in chart.  She states that she drinks 1-2 glasses of wine nightly    Heart rate in office elevated today.  She has fatigue, but denies chest pain or SOB.     ASSESSMENT & PLAN  Encounter Diagnoses  Name Primary?   Petechial rash Yes   Thrombocytopenia    Alcohol abuse    Tachycardia     Orders Placed This Encounter  Procedures   B12 and Folate Panel   Iron, TIBC and Ferritin Panel   Reticulocytes   TSH + free T4   CBC with Differential/Platelet   Comprehensive metabolic panel with GFR   Hemoglobin A1c   Lipid panel   Urinalysis, Routine w reflex microscopic   Protime-INR   Ammonia   Lipase   C-reactive protein   EKG 12-Lead    Assessment and Plan    Thrombocytopenia with petechial rash Thrombocytopenia likely due to low platelet count. Possible causes include liver dysfunction, infections, or hematological disorders.   - Suspect liver issues related as seen in previous hospital workup.  - Order CBC to assess platelet levels, LFT's coagulation studies, iron, B12. TSH - Schedule follow-up appointment next week   - Hepatic steatosis seen on previous US  of liver with perihepatic ascites  Tachycardia Tachycardia with elevated heart rate. Possible causes include anxiety, anemia, or thyroid  dysfunction. - Monitor heart rate at home.  ECG today to confirm sinus rhythm.  - Instruct to report if heart rate remains  above 110 bpm. - Order thyroid  function tests. - Order CBC to assess for anemia.  Labs today  Hypertension Hypertension controlled with losartan  25 mg. Blood pressure within target range at home. Occasional elevated readings due to anxiety.  Peripheral neuropathy of the feet Peripheral neuropathy with numbness for a year. Possible causes include vitamin deficiencies  or systemic conditions.  She reports a change in balance.  Query B12/folate deficiency given ETOH history  Secondary amenorrhea Secondary amenorrhea for two years. Possible causes include premature menopause or systemic disease.  Will discuss in the future.  Last PAP 3 yrs prior, unremarkable   Tobacco use disorder Tobacco use disorder with smoking half a pack per day since age 8.  Alcohol use above recommended limits Alcohol consumption above recommended limits. Potential impact on liver function and coagulation considered.  General Health Maintenance - Administer flu vaccine. - Advise to obtain COVID-19 booster at pharmacy.        Objective  BP (!) 140/60   Pulse (!) 106   Ht 5' 4 (1.626 m)   Wt 131 lb 6.4 oz (59.6 kg)   LMP  (LMP Unknown)   SpO2 96%   BMI 22.55 kg/m      Review of Systems  Constitutional:  Negative for chills, fever and weight loss.  Eyes:  Negative for blurred vision. h Respiratory:  Negative for cough and shortness of breath.   Cardiovascular:  Negative for chest pain and palpitations.  Skin:  Negative for rash.  Psychiatric/Behavioral:  Negative for depression. The patient is not nervous/anxious.      Physical Exam Physical Exam Vitals reviewed.  Constitutional:      Appearance: Normal appearance. Well-developed with normal weight.  HENT:     Head: Normocephalic and atraumatic.  Normal mucous membranes, no oral lesions Eyes:     Pupils: Pupils are equal, round, and reactive to light.  Neck:     Thyroid : No thyroid  mass or thyromegaly.  Cardiovascular:     Rate and Rhythm: Normal rate and regular rhythm. Normal heart sounds. Normal peripheral pulses Pulmonary:     Normal breath sounds with normal effort Abdominal:   Abdomen is soft.  Hepatosplenomegaly noted Musculoskeletal:        General: No swelling or edema  Lymphadenopathy:     Cervical: No cervical adenopathy.  Skin: War and dry.  Diffuse petechial rash  Neurological:      General: No focal deficit present.  Psychiatric:        Mood and Affect: Mood, behavior and cognition normal   Past Medical History:  Diagnosis Date   Hypertension    Past Surgical History:  Procedure Laterality Date   ESOPHAGOGASTRODUODENOSCOPY (EGD) WITH PROPOFOL  N/A 10/14/2022   Procedure: ESOPHAGOGASTRODUODENOSCOPY (EGD) WITH PROPOFOL ;  Surgeon: Onita Elspeth Sharper, DO;  Location: [facility] ENDOSCOPY;  Service: Gastroenterology;  Laterality: N/A;   Family Status  Relation Name Status   Mother  Alive   Father  Alive   Sister  Alive   Daughter  Alive   MGM  Deceased   MGF  Deceased   PGM  Deceased   PGF  Alive  No partnership data on file   Family History  Problem Relation Age of Onset   Colon polyps Mother    Skin cancer Mother    Diabetes Father    Diabetes Sister    Migraines Daughter    Diabetes Maternal Grandmother    Dementia Maternal Grandmother    Diabetes Maternal Grandfather    Diabetes  Paternal Grandmother    Social History   Socioeconomic History   Marital status: Married    Spouse name: Not on file   Number of children: Not on file   Years of education: Not on file   Highest education level: Not on file  Occupational History   Not on file  Tobacco Use   Smoking status: Every Day    Types: Cigarettes   Smokeless tobacco: Never  Vaping Use   Vaping status: Never Used  Substance and Sexual Activity   Alcohol use: Yes    Alcohol/week: 1.0 standard drink of alcohol    Types: 1 Glasses of wine per week   Drug use: Never   Sexual activity: Yes  Other Topics Concern   Not on file  Social History Narrative   Not on file   Social Drivers of Health   Financial Resource Strain: Not on file  Food Insecurity: No Food Insecurity (10/14/2022)   Hunger Vital Sign    Worried About Running Out of Food in the Last Year: Never true    Ran Out of Food in the Last Year: Never true  Transportation Needs: No Transportation Needs (10/14/2022)   PRAPARE -  Administrator, Civil Service (Medical): No    Lack of Transportation (Non-Medical): No  Physical Activity: Not on file  Stress: Not on file  Social Connections: Not on file   Outpatient Medications Prior to Visit  Medication Sig   losartan  (COZAAR ) 25 MG tablet Take 1 tablet (25 mg total) by mouth daily.   Multiple Vitamins-Minerals (MULTIPLE VITAMINS/WOMENS PO) Take by mouth.   ferrous sulfate  325 (65 FE) MG tablet Take 1 tablet (325 mg total) by mouth daily.   folic acid  (FOLVITE ) 1 MG tablet Take 1 tablet (1 mg total) by mouth daily.   [DISCONTINUED] pantoprazole  (PROTONIX ) 40 MG tablet Take 1 tablet (40 mg total) by mouth 2 (two) times daily before a meal.   No facility-administered medications prior to visit.   No Known Allergies   There is no immunization history on file for this patient.  Health Maintenance  Topic Date Due   DTaP/Tdap/Td (1 - Tdap) Never done   Pneumococcal Vaccine (1 of 2 - PCV) Never done   Hepatitis B Vaccines 19-59 Average Risk (1 of 3 - 19+ 3-dose series) Never done   HPV VACCINES (1 - 3-dose SCDM series) Never done   Cervical Cancer Screening (HPV/Pap Cotest)  Never done   Influenza Vaccine  07/04/2024   COVID-19 Vaccine (3 - 2025-26 season) 08/04/2024   Hepatitis C Screening  Completed   HIV Screening  Completed   Meningococcal B Vaccine  Aged Out    Patient Care Team: Tylee Yum A, MD as PCP - General (Family Medicine)  Depression Screen     No data to display           Parris DELENA Juneau, MD  [facility] [phone] (phone) [phone] (fax)  [facility]

## 2024-09-08 NOTE — Addendum Note (Signed)
 Addended by: BLINDA DOUGLAS on: 09/08/2024 04:08 PM   Modules accepted: Orders

## 2024-09-08 NOTE — Addendum Note (Signed)
 Addended by: BLINDA DOUGLAS on: 09/08/2024 03:46 PM   Modules accepted: Orders

## 2024-09-09 ENCOUNTER — Telehealth (INDEPENDENT_AMBULATORY_CARE_PROVIDER_SITE_OTHER)

## 2024-09-09 DIAGNOSIS — R000 Tachycardia, unspecified: Secondary | ICD-10-CM

## 2024-09-09 DIAGNOSIS — F1010 Alcohol abuse, uncomplicated: Secondary | ICD-10-CM

## 2024-09-09 DIAGNOSIS — R7401 Elevation of levels of liver transaminase levels: Secondary | ICD-10-CM

## 2024-09-09 DIAGNOSIS — D696 Thrombocytopenia, unspecified: Secondary | ICD-10-CM

## 2024-09-09 DIAGNOSIS — R233 Spontaneous ecchymoses: Secondary | ICD-10-CM

## 2024-09-09 LAB — LIPID PANEL
Cholesterol: 230 mg/dL — ABNORMAL HIGH (ref <200)
HDL: 58 mg/dL (ref >=50)
LDL Cholesterol (Calc): 146 mg/dL — ABNORMAL HIGH
Non-HDL Cholesterol (Calc): 172 mg/dL — ABNORMAL HIGH (ref <130)
Total CHOL/HDL Ratio: 4 (calc) (ref <5.0)
Triglycerides: 134 mg/dL (ref <150)

## 2024-09-09 LAB — COMPREHENSIVE METABOLIC PANEL WITH GFR
AG Ratio: 0.8 (calc) — ABNORMAL LOW (ref 1.0–2.5)
ALT: 21 U/L (ref 6–29)
AST: 87 U/L — ABNORMAL HIGH (ref 10–30)
Albumin: 3.7 g/dL (ref 3.6–5.1)
Alkaline phosphatase (APISO): 140 U/L — ABNORMAL HIGH (ref 31–125)
BUN/Creatinine Ratio: 13 (calc) (ref 6–22)
BUN: 6 mg/dL — ABNORMAL LOW (ref 7–25)
CO2: 24 mmol/L (ref 20–32)
Calcium: 8.7 mg/dL (ref 8.6–10.2)
Chloride: 103 mmol/L (ref 98–110)
Creat: 0.48 mg/dL — ABNORMAL LOW (ref 0.50–0.97)
Globulin: 4.5 g/dL — ABNORMAL HIGH (ref 1.9–3.7)
Glucose, Bld: 93 mg/dL (ref 65–99)
Potassium: 3.7 mmol/L (ref 3.5–5.3)
Sodium: 138 mmol/L (ref 135–146)
Total Bilirubin: 2.9 mg/dL — ABNORMAL HIGH (ref 0.2–1.2)
Total Protein: 8.2 g/dL — ABNORMAL HIGH (ref 6.1–8.1)
eGFR: 124 mL/min/1.73m2 (ref >=60)

## 2024-09-09 LAB — URINALYSIS, ROUTINE W REFLEX MICROSCOPIC
Bilirubin Urine: NEGATIVE
Glucose, UA: NEGATIVE
Hgb urine dipstick: NEGATIVE
Hyaline Cast: NONE SEEN /LPF
Ketones, ur: NEGATIVE
Nitrite: NEGATIVE
Protein, ur: NEGATIVE
RBC / HPF: NONE SEEN /HPF (ref 0–2)
Specific Gravity, Urine: 1.005 (ref 1.001–1.035)
pH: 7 (ref 5.0–8.0)

## 2024-09-09 LAB — CBC WITH DIFFERENTIAL/PLATELET
Absolute Lymphocytes: 1769 {cells}/uL (ref 850–3900)
Absolute Monocytes: 932 {cells}/uL (ref 200–950)
Basophils Absolute: 52 {cells}/uL (ref 0–200)
Basophils Relative: 0.7 %
Eosinophils Absolute: 0 {cells}/uL — ABNORMAL LOW (ref 15–500)
Eosinophils Relative: 0 %
HCT: 37.7 % (ref 35.0–45.0)
Hemoglobin: 13.3 g/dL (ref 11.7–15.5)
MCH: 36.2 pg — ABNORMAL HIGH (ref 27.0–33.0)
MCHC: 35.3 g/dL (ref 32.0–36.0)
MCV: 102.7 fL — ABNORMAL HIGH (ref 80.0–100.0)
MPV: 11.9 fL (ref 7.5–12.5)
Monocytes Relative: 12.6 %
Neutro Abs: 4647 {cells}/uL (ref 1500–7800)
Neutrophils Relative %: 62.8 %
Platelets: 31 Thousand/uL — ABNORMAL LOW (ref 140–400)
RBC: 3.67 Million/uL — ABNORMAL LOW (ref 3.80–5.10)
RDW: 12.9 % (ref 11.0–15.0)
Total Lymphocyte: 23.9 %
WBC: 7.4 Thousand/uL (ref 3.8–10.8)

## 2024-09-09 LAB — RETICULOCYTES
ABS Retic: 44040 {cells}/uL (ref 20000–80000)
Retic Ct Pct: 1.2 %

## 2024-09-09 LAB — HEMOGLOBIN A1C
Hgb A1c MFr Bld: 5.7 % — ABNORMAL HIGH (ref <5.7)
Mean Plasma Glucose: 117 mg/dL
eAG (mmol/L): 6.5 mmol/L

## 2024-09-09 LAB — IRON,TIBC AND FERRITIN PANEL
%SAT: 34 % (ref 16–45)
Ferritin: 244 ng/mL — ABNORMAL HIGH (ref 16–154)
Iron: 244 ug/dL — AB (ref 40–190)
TIBC: 260 ug/dL (ref 250–450)

## 2024-09-09 LAB — PROTIME-INR
INR: 1.3 — ABNORMAL HIGH
Prothrombin Time: 13.8 s — ABNORMAL HIGH (ref 9.0–11.5)

## 2024-09-09 LAB — C-REACTIVE PROTEIN: CRP: 6.9 mg/L (ref <8.0)

## 2024-09-09 LAB — LIPASE: Lipase: 17 U/L (ref 7–60)

## 2024-09-09 LAB — MICROSCOPIC MESSAGE

## 2024-09-09 LAB — B12 AND FOLATE PANEL
Folate: 10.2 ng/mL
Vitamin B-12: 598 pg/mL (ref 200–1100)

## 2024-09-09 LAB — TSH+FREE T4: TSH W/REFLEX TO FT4: 2.05 m[IU]/L

## 2024-09-09 NOTE — Progress Notes (Signed)
 Progress Note  Physician: Kylei Purington A Fidencio Duddy, MD   Patient contacted 09/09/24 at 10:40 AM EDT by a video enabled telemedicine application and verified that I am speaking with the correct person.   Patient is aware of limitations of evaluation by telemedicine The patient expressed understanding and agreed to proceed.   HPI: Andrea Franco is a 38 y.o. female presenting on 09/09/2024 for No chief complaint on file. . Patient seen in follow-up by telephone visit.  Had been seen yesterday for the first time.  She presented with a diffuse petechial rash over her entire body which has been present for more than a year.   Liver enzymes notable with for an AST of 87 with a total bilirubin bilirubin of 2.9.  Her platelets are at 31 with elevated PT and INR.    Patient has bleeding from her gums but no other signs of bleeding no ecchymosis.  She has had no change in stool color and no blood in urine.  Heart rate in office somewhat elevated 100-110 bpm.  She reports today that this is normalized and around 100.  Patient has a history of daily alcohol intake for a number of years - she reports 1-2 drinks daily.   She had had labs done in 2023 which showed low platelets and then was lost to follow-up.  Ultrasound of the liver done in 2023 showing cholelithiasis with gallbladder wall thickening and peri-cholecystic fluid she had hepatic steatosis with a small volume of perihepatic ascites.  No further follow-up has been done since this time.  Patient was last seen in the emergency department in November of last year with epistaxis and it was noted at the time her platelets were at 60.  Patient noted to have on upper endoscopy in 2023 nonbleeding duodenal and gastric ulcers as well as esophagitis.    Social history:  Relevant past medical, surgical, family and social history reviewed and updated as indicated. Interim medical history since our last visit reviewed.  Allergies and medications  reviewed and updated.  DATA REVIEWED: CHART IN EPIC  ROS: Negative unless specifically indicated above in HPI.    Current Outpatient Medications:    ferrous sulfate  325 (65 FE) MG tablet, Take 1 tablet (325 mg total) by mouth daily., Disp: 90 tablet, Rfl: 0   folic acid  (FOLVITE ) 1 MG tablet, Take 1 tablet (1 mg total) by mouth daily., Disp: 90 tablet, Rfl: 0   losartan  (COZAAR ) 25 MG tablet, Take 1 tablet (25 mg total) by mouth daily., Disp: 30 tablet, Rfl: 11   Multiple Vitamins-Minerals (MULTIPLE VITAMINS/WOMENS PO), Take by mouth., Disp: , Rfl:       Objective:  Telephone visit     Assessment & Plan:  Alcohol abuse -     US  Abdomen Complete; Future -     Ambulatory referral to Gastroenterology  Thrombocytopenia -     US  Abdomen Complete; Future -     Ambulatory referral to Gastroenterology  Transaminitis -     US  Abdomen Complete; Future -     Ambulatory referral to Gastroenterology  Tachycardia  Petechial rash  Discussed briefly with the gastroenterologist on the inpatient service for plan count and need for any inpatient interventions.  Since she is not having active bleeding this can be managed in the outpatient setting.  Concerned that she will have a further drop in platelets due to her chronic liver disease.  We we will follow-up with  her next week.  Plan for referral on a more urgent basis to gastroenterology.  Patient advised to discontinue alcohol intake.  Patient will continue to monitor for any signs of bleeding at home.  Discussed with patient that if she has blood in urine and change in stool color abdominal pain or other concerns that she needs to be seen immediately.  Otherwise continue to monitor blood pressure and heart rate at home.   Plan to repeat ultrasound of the liver.  She will likely need additional imaging.   No follow-ups on file.

## 2024-09-11 ENCOUNTER — Ambulatory Visit
Admission: RE | Admit: 2024-09-11 | Discharge: 2024-09-11 | Disposition: A | Discharge status: Home / Self Care | Payer: Self-pay | Source: Ambulatory Visit

## 2024-09-11 DIAGNOSIS — F1010 Alcohol abuse, uncomplicated: Secondary | ICD-10-CM | POA: Insufficient documentation

## 2024-09-11 DIAGNOSIS — D696 Thrombocytopenia, unspecified: Secondary | ICD-10-CM | POA: Insufficient documentation

## 2024-09-11 DIAGNOSIS — R7401 Elevation of levels of liver transaminase levels: Secondary | ICD-10-CM | POA: Insufficient documentation

## 2024-09-16 ENCOUNTER — Ambulatory Visit (INDEPENDENT_AMBULATORY_CARE_PROVIDER_SITE_OTHER): Payer: Self-pay

## 2024-09-16 ENCOUNTER — Encounter: Payer: Self-pay | Admitting: Gastroenterology

## 2024-09-16 VITALS — BP 132/80 | HR 95 | Ht 64.0 in | Wt 132.4 lb

## 2024-09-16 DIAGNOSIS — K7460 Unspecified cirrhosis of liver: Secondary | ICD-10-CM

## 2024-09-16 DIAGNOSIS — E785 Hyperlipidemia, unspecified: Secondary | ICD-10-CM | POA: Insufficient documentation

## 2024-09-16 DIAGNOSIS — E782 Mixed hyperlipidemia: Secondary | ICD-10-CM

## 2024-09-16 DIAGNOSIS — R7401 Elevation of levels of liver transaminase levels: Secondary | ICD-10-CM

## 2024-09-16 DIAGNOSIS — I10 Essential (primary) hypertension: Secondary | ICD-10-CM

## 2024-09-16 DIAGNOSIS — D696 Thrombocytopenia, unspecified: Secondary | ICD-10-CM

## 2024-09-16 DIAGNOSIS — F1010 Alcohol abuse, uncomplicated: Secondary | ICD-10-CM

## 2024-09-16 DIAGNOSIS — R000 Tachycardia, unspecified: Secondary | ICD-10-CM

## 2024-09-16 MED ORDER — LOSARTAN POTASSIUM 25 MG PO TABS: 25.0000 mg | ORAL_TABLET | Freq: Every day | ORAL | 11 refills | Status: AC

## 2024-09-16 NOTE — Progress Notes (Signed)
.  fp

## 2024-09-16 NOTE — Progress Notes (Signed)
 Progress Note  Physician: Kioni Stahl A Rhianon Zabawa, MD   HPI: Andrea Franco is a 38 y.o. female presenting on 09/16/2024 for Follow-up (Concerned about blood work and EKG results.) .  Discussed the use of AI scribe software for clinical note transcription with the patient, who gave verbal consent to proceed.  History of Present Illness   Andrea Franco is a 38 year old female with liver cirrhosis who presents for follow-up and management of her condition.  Liver cirrhosis and hepatic dysfunction - Diagnosed with liver cirrhosis - elevated liver enzymes and bilirubin levels, mild splenomegaly - Negative screening for hepatitis B and C, elevated ammonia in the past, result pending - No alcohol consumption since last visit - advised to abstain, though triggers are present in daily routine - Ferritin levels elevated - Hemoglobin A1c slightly elevated - Scheduled for gastroenterology consultation on November 13th - will await further evaluation  Thrombocytopenia and bleeding risk - Low platelet count 30k - Advised to monitor for signs of bleeding, including spontaneous bruising and bleeding gums - No current bleeding from the mouth, blood in urine, or blood in stool  Gallbladder disease and abdominal pain - History of gallstones and thickened gallbladder wall on imaging, HIDA recommended - No recent abdominal pain  Cardiovascular and metabolic findings - Elevated heart rate at home, ranging from 88 to 104 beats per minute - Normal TSH.  Query ETOH withdrawal role vs other etiology  - Total cholesterol 230 mg/dL, LDL 859 mg/dL  - Engages in regular physical activity by walking up and down stairs         Medical history:  Relevant past medical, surgical, family and social history reviewed and updated as indicated. Interim medical history since our last visit reviewed.  Allergies and medications reviewed and updated.   ROS: Negative unless specifically indicated above  in HPI.    Current Outpatient Medications:    folic acid  (FOLVITE ) 1 MG tablet, Take 1 tablet (1 mg total) by mouth daily., Disp: 90 tablet, Rfl: 0   Multiple Vitamins-Minerals (MULTIPLE VITAMINS/WOMENS PO), Take by mouth., Disp: , Rfl:    ferrous sulfate  325 (65 FE) MG tablet, Take 1 tablet (325 mg total) by mouth daily., Disp: 90 tablet, Rfl: 0   losartan  (COZAAR ) 25 MG tablet, Take 1 tablet (25 mg total) by mouth daily., Disp: 30 tablet, Rfl: 11       Objective:     BP 132/80   Pulse 95   Ht 5' 4 (1.626 m)   Wt 132 lb 6.4 oz (60.1 kg)   LMP  (LMP Unknown)   SpO2 97%   BMI 22.73 kg/m   Wt Readings from Last 3 Encounters:  09/16/24 132 lb 6.4 oz (60.1 kg)  09/08/24 131 lb 6.4 oz (59.6 kg)  10/14/22 119 lb (54 kg)    Physical Exam  Physical Exam Vitals reviewed.  Constitutional:      Appearance: Normal appearance. Well-developed with normal weight.  Cardiovascular:     Rate and Rhythm: Normal rate and regular rhythm. Normal heart sounds. Normal peripheral pulses Pulmonary:     Normal breath sounds with normal effort Skin:    General: Skin is warm and dry without noticeable rash. Neurological:     General: No focal deficit present.  Psychiatric:        Mood and Affect: Mood, behavior and cognition normal      Assessment & Plan:   Encounter Diagnoses  Name Primary?   Hypertension, unspecified type Yes   Tachycardia    Hepatic cirrhosis, unspecified hepatic cirrhosis type, unspecified whether ascites present (HCC)    Thrombocytopenia    Transaminitis    Alcohol abuse     No orders of the defined types were placed in this encounter.    Assessment and Plan    Liver cirrhosis with thrombocytopenia Elevated liver enzymes and bilirubin indicate liver dysfunction. High ferritin and alkaline phosphatase suggest potential liver and gallbladder issues. No hepatitis B, C, or HIV. No varices on endoscopy per patient but I cannot find report. Abstaining from  alcohol. - Monitor for bleeding signs. - Avoid NSAIDs. - Limit acetaminophen. - Report new symptoms immediately. - Encourage alcohol abstinence.  Gallstones with thickened gallbladder wall Gallstones with thickened gallbladder wall. Elevated bilirubin suggests possible obstruction. Awaiting gastroenterology evaluation.  Sinus tachycardia Sinus tachycardia with heart rate 88-104 bpm, possibly anxiety-related. No jitteriness. - Monitor heart rate at home.  - No anemia or signs of volume depletion  Hypertension Taking losartan  25 mg. BP 132/80 - Ensure losartan  prescription is filled. - Monitor blood pressure at home  Hypercholesterolemia Total cholesterol 230 mg/dL, LDL 859 mg/dL. Medication not preferred due to liver impact. - Advise dietary modifications. - Encourage vegetables, fiber, lean proteins. - Monitor cholesterol levels.  Prediabetes Slightly elevated A1c, likely related to liver dysfunction. Monitoring necessary  Alcohol use, in remission Abstaining from alcohol. - Encourage alcohol abstinence.      Will fu in 6 weeks after GI appt complete

## 2024-10-16 ENCOUNTER — Ambulatory Visit (INDEPENDENT_AMBULATORY_CARE_PROVIDER_SITE_OTHER): Payer: Self-pay | Admitting: Gastroenterology

## 2024-10-16 ENCOUNTER — Encounter: Payer: Self-pay | Admitting: Gastroenterology

## 2024-10-16 ENCOUNTER — Other Ambulatory Visit (INDEPENDENT_AMBULATORY_CARE_PROVIDER_SITE_OTHER): Payer: Self-pay

## 2024-10-16 VITALS — BP 150/90 | HR 107 | Ht 63.0 in | Wt 131.2 lb

## 2024-10-16 DIAGNOSIS — G621 Alcoholic polyneuropathy: Secondary | ICD-10-CM

## 2024-10-16 DIAGNOSIS — D696 Thrombocytopenia, unspecified: Secondary | ICD-10-CM

## 2024-10-16 DIAGNOSIS — Z8711 Personal history of peptic ulcer disease: Secondary | ICD-10-CM

## 2024-10-16 DIAGNOSIS — K7030 Alcoholic cirrhosis of liver without ascites: Secondary | ICD-10-CM

## 2024-10-16 DIAGNOSIS — K8020 Calculus of gallbladder without cholecystitis without obstruction: Secondary | ICD-10-CM

## 2024-10-16 LAB — GAMMA GT: GGT: 489 U/L — ABNORMAL HIGH (ref 7–51)

## 2024-10-16 MED ORDER — NALTREXONE HCL 50 MG PO TABS: 50.0000 mg | ORAL_TABLET | Freq: Every day | ORAL | 3 refills | Status: AC

## 2024-10-16 NOTE — Patient Instructions (Signed)
 Your provider has requested that you go to the basement level for lab work before leaving today. Press B on the elevator. The lab is located at the first door on the left as you exit the elevator.  We have sent the following medications to your pharmacy for you to pick up at your convenience:naltrexone.  You have been scheduled for an endoscopy. Please follow written instructions given to you at your visit today.  If you use inhalers (even only as needed), please bring them with you on the day of your procedure.  If you take any of the following medications, they will need to be adjusted prior to your procedure:   DO NOT TAKE 7 DAYS PRIOR TO TEST- Trulicity (dulaglutide) Ozempic, Wegovy (semaglutide) Mounjaro, Zepbound (tirzepatide) Bydureon Bcise (exanatide extended release)  DO NOT TAKE 1 DAY PRIOR TO YOUR TEST Rybelsus (semaglutide) Adlyxin (lixisenatide) Victoza (liraglutide) Byetta (exanatide) ___________________________________________________________________________  _______________________________________________________  If your blood pressure at your visit was 140/90 or greater, please contact your primary care physician to follow up on this.  _______________________________________________________  If you are age 39 or older, your body mass index should be between 23-30. Your Body mass index is 23.25 kg/m. If this is out of the aforementioned range listed, please consider follow up with your Primary Care Provider.  If you are age 39 or younger, your body mass index should be between 19-25. Your Body mass index is 23.25 kg/m. If this is out of the aformentioned range listed, please consider follow up with your Primary Care Provider.   ________________________________________________________  The [facility] providers would like to encourage you to use MYCHART to communicate with providers for non-urgent requests or questions.  Due to long hold times on the telephone,  sending your provider a message by [facility] may be a faster and more efficient way to get a response.  Please allow 48 business hours for a response.  Please remember that this is for non-urgent requests.  _______________________________________________________  [facility] is using a team-based approach to care.  Your team is made up of your doctor and two to three APPS. Our APPS (Nurse Practitioners and Physician Assistants) work with your physician to ensure care continuity for you. They are fully qualified to address your health concerns and develop a treatment plan. They communicate directly with your gastroenterologist to care for you. Seeing the Advanced Practice Practitioners on your physician's team can help you by facilitating care more promptly, often allowing for earlier appointments, access to diagnostic testing, procedures, and other specialty referrals.

## 2024-10-16 NOTE — Progress Notes (Signed)
 Discussed the use of AI scribe software for clinical note transcription with the patient, who gave verbal consent to proceed.  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 She tells me she was referred by her primary care physician for evaluation of low platelet count and gallbladder concerns.  She has a history of alcohol-induced liver disease and was previously hospitalized in November 2023 for melena, nausea, and vomiting. An upper endoscopy at that time revealed multiple gastroduodenal ulcers with clean bases, gastritis, duodenitis with contact bleeding, and LA grade D reflux esophagitis. An ultrasound showed cholelithiasis, gallbladder wall thickening, a steatotic liver, and small volume ascites. A repeat ultrasound last month again showed cholelithiasis and gallbladder wall thickening, with the liver appearing coarsened and nodular. Mild splenomegaly was also noted. She says this is the first time anyone has told her she has liver problems.  Her most recent INR was slightly elevated at 1.3, with liver enzymes showing hyperbilirubinemia (bilirubin 2.9), AST of 87, ALT 21, and ALK phos 140. She has significant thrombocytopenia, with a platelet count of 31,000, down from 60,000 a year ago and 130,000 in November 2023. She has not been tested for other causes of liver disease.  MCV elevated.  She reports drinking a glass of wine nightly for about six years, with heavier drinking in her early twenties. She is pre-diabetic and has high cholesterol. She has been taking omeprazole twice daily since her endoscopy two years ago, but now takes it only when anticipating eating certain foods.  No dizziness, fainting, nausea, vomiting, jaundice, dark urine, abdominal swelling, or leg swelling. She experiences tingling and numbness in her feet occasionally, gum bleeding, and a rash on her chest, back, and hands,  which is not itchy or bothersome. She has noticed weight gain from 119 lbs two years ago to 131 lbs now.     No family history of liver disease.    Abdominal ultrasound Oct 2025 IMPRESSION: 1. Cholelithiasis with gallbladder wall thickening. No sonographic Beverley sign reported by the sonographer. Wall thickening is favored sequela of chronic hepatic disease. Consider further evaluation with nuclear medicine HIDA scan with calculation of ejection fraction. 2. Coarsened hepatic echotexture with heterogeneous echogenicity and contour nodularity, suggestive of cirrhosis. No discrete hepatic lesion identified. 3. Mild splenomegaly which would be compatible with portal venous hypertension in the setting of cirrhosis  RUQUS 2023 IMPRESSION: 1. Cholelithiasis with mild gallbladder wall thickening and pericholecystic fluid, nonspecific in the setting of ascites. Negative sonographic Murphy sign. No biliary ductal dilatation. Findings are equivocal for acute cholecystitis. Consider further evaluation with HIDA scan if indicated by clinical concern for cholecystitis. 2. Hepatic steatosis. 3. Small volume perihepatic ascites   EGD Nov 2023 - Duodenitis.  - Non-bleeding duodenal ulcer with a clean ulcer base (Forrest Class III).  - Gastritis oozing.  - Non-bleeding gastric ulcer with a clean ulcer base (Forrest Class III).  - LA Grade D esophagitis with no bleeding.  - Esophagogastric landmarks identified.  - No specimens collected.   Past Medical History:  Diagnosis Date   Epistaxis 09/08/2024   Hypertension      Past Surgical History:  Procedure Laterality Date   ESOPHAGOGASTRODUODENOSCOPY (EGD) WITH PROPOFOL  N/A 10/14/2022   Procedure: ESOPHAGOGASTRODUODENOSCOPY (EGD) WITH PROPOFOL ;  Surgeon: Onita Elspeth Sharper, DO;  Location: [facility] ENDOSCOPY;  Service: Gastroenterology;  Laterality: N/A;   Family History  Problem Relation Age of Onset  Colon polyps Mother    Skin  cancer Mother    Diabetes Father    Diabetes Sister    Migraines Daughter    Diabetes Maternal Grandmother    Dementia Maternal Grandmother    Diabetes Maternal Grandfather    Diabetes Paternal Grandmother    Social History   Tobacco Use   Smoking status: Every Day    Types: Cigarettes   Smokeless tobacco: Never  Vaping Use   Vaping status: Never Used  Substance Use Topics   Alcohol use: Yes    Alcohol/week: 1.0 standard drink of alcohol    Types: 1 Glasses of wine per week   Drug use: Never   Current Outpatient Medications  Medication Sig Dispense Refill   folic acid  (FOLVITE ) 1 MG tablet Take 1 tablet (1 mg total) by mouth daily. 90 tablet 0   losartan  (COZAAR ) 25 MG tablet Take 1 tablet (25 mg total) by mouth daily. 30 tablet 11   Multiple Vitamins-Minerals (MULTIPLE VITAMINS/WOMENS PO) Take by mouth.     No current facility-administered medications for this visit.   No Known Allergies   Review of Systems: All systems reviewed and negative except where noted in HPI.    No results found.  Physical Exam: BP (!) 150/90   Pulse (!) 107   Ht 5' 3 (1.6 m)   Wt 131 lb 4 oz (59.5 kg)   BMI 23.25 kg/m  Constitutional: Pleasant,well-developed, Caucasian female in no acute distress. HEENT: Normocephalic and atraumatic. Conjunctivae are normal. No scleral icterus. Neck supple.  Cardiovascular: Normal rate, regular rhythm.  Pulmonary/chest: Effort normal and breath sounds normal. No wheezing, rales or rhonchi. Abdominal: Soft, nondistended, nontender. Bowel sounds active throughout. There are no masses palpable. Liver enlarged, nontender. Extremities: no edema Lymphadenopathy: No cervical adenopathy noted. Neurological: Alert and oriented to person place and time.  No asterixis. Skin: Skin is warm and dry. Telangiectiasias noted on face, chest/abdomen. Psychiatric: Normal mood and affect. Behavior is normal.  CBC    Component Value Date/Time   WBC 7.4 09/08/2024  1516   RBC 3.67 (L) 09/08/2024 1516   HGB 13.3 09/08/2024 1516   HGB 10.4 (L) 04/03/2012 2154   HCT 37.7 09/08/2024 1516   HCT 26.2 (L) 04/05/2012 0552   PLT 31 (L) 09/08/2024 1516   PLT 160 04/03/2012 2154   MCV 102.7 (H) 09/08/2024 1516   MCV 92 04/03/2012 2154   MCH 36.2 (H) 09/08/2024 1516   MCHC 35.3 09/08/2024 1516   RDW 12.9 09/08/2024 1516   RDW 13.2 04/03/2012 2154   LYMPHSABS 2.0 04/03/2012 2154   MONOABS 0.9 04/03/2012 2154   EOSABS 0 (L) 09/08/2024 1516   EOSABS 0.2 04/03/2012 2154   BASOSABS 52 09/08/2024 1516   BASOSABS 0.0 04/03/2012 2154    CMP     Component Value Date/Time   NA 138 09/08/2024 1516   K 3.7 09/08/2024 1516   CL 103 09/08/2024 1516   CO2 24 09/08/2024 1516   GLUCOSE 93 09/08/2024 1516   BUN 6 (L) 09/08/2024 1516   CREATININE 0.48 (L) 09/08/2024 1516   CALCIUM 8.7 09/08/2024 1516   PROT 8.2 (H) 09/08/2024 1516   ALBUMIN 3.8 10/28/2023 1556   AST 87 (H) 09/08/2024 1516   ALT 21 09/08/2024 1516   ALKPHOS 142 (H) 10/28/2023 1556   BILITOT 2.9 (H) 09/08/2024 1516   GFRNONAA NOT CALCULATED 10/28/2023 1556       Latest Ref Rng & Units 09/08/2024    3:16 PM  10/28/2023    3:56 PM 10/15/2022    5:36 AM  CBC EXTENDED  WBC 3.8 - 10.8 Thousand/uL 7.4  7.5  7.0   RBC 3.80 - 5.10 Million/uL 3.67  3.57  2.32   Hemoglobin 11.7 - 15.5 g/dL 86.6  86.6  8.7   HCT 64.9 - 45.0 % 37.7  38.7  25.3   Platelets 140 - 400 Thousand/uL 31  60  84   NEUT# 1,500 - 7,800 cells/uL 4,647      Computed MELD 3.0 unavailable. One or more values for this score either were not found within the given timeframe or did not fit some other criterion. MELD-Na: 13 at 09/08/2024  3:16 PM Calculated from: Serum Creatinine: 0.48 mg/dL (Using min of 1 mg/dL) at 89/02/7973  6:83 PM Serum Sodium: 138 mmol/L (Using max of 137 mmol/L) at 09/08/2024  3:16 PM Total Bilirubin: 2.9 mg/dL at 89/02/7973  6:83 PM INR(ratio): 1.3 at 09/08/2024  3:16 PM    ASSESSMENT AND  PLAN:  38 year old female with longstanding alcohol use, currently reporting 1 glass of wine per night.  She has radiographic and laboratory evidence of cirrhosis with portal hypertension, most consistent with alcohol-induced cirrhosis.   Her liver has a nodular contour, she has severe thrombocytopenia, elevated bilirubin, high AST/ALT ratio, mildly elevated INR and hyperbilirubinemia.  This constellation of findings is very consistent with alcohol-induced cirrhosis.  She also has symptoms of peripheral neuropathy, which is likely alcohol induced.  Recent B12 level normal. I suspect she has been drinking much more than 1 glass of wine per night. We had a long discussion about alcohol and cirrhosis of the liver.  I informed her that any ongoing alcohol use past this point will definitively result in an early death for her.  She has 2 children, aged 53 and 74.  I advised her that if she wants to see them grow up, she has to stop drinking completely.  She is aware that there is no safe level of alcohol consumption at this point, and that alcohol must be completely eliminated from her life.  We discussed the numerous different complications that can occur from cirrhosis and portal hypertension. The patient states that she feels confident she will be able to stop drinking.  We did discuss naltrexone as an adjuvant therapy to help reduce cravings and help people stop drinking.  I think she would be a good candidate for this, and the patient would like to start.  We discussed potential complications such as GI side effects, lightheadedness and fatigue.  Although alcohol is the most likely cause of her liver disease, she has not been evaluated for any other concomitant liver disease that may be contributing.  Will obtain blood test to rule out viral, autoimmune and genetic causes of liver disease.  Her thrombocytopenia is quite severe, and seems a little out of proportion to the degree of her cirrhosis and portal  hypertension.  Will await blood tests and EGD results.  May consider referral to hematology to consider additional causes of thrombocytopenia  Alcohol induced cirrhosis - Order blood tests to rule out other causes of chronic liver disease (viral, autoimmune, genetic). - Schedule upper endoscopy for variceal screening and to assess resolution of gastric ulcers noted in 2023 - Prescribe naltrexone 50 mg daily for alcohol cessation. - Follow up in two months to recheck platelet count and consider hematology referral if needed.  Cirrhosis management: -MELD:13 -CPT: A -Hepatoma screening-Oct 2025 -Liver synthetic function minimally impaired with normal  albumin, slightly high INR -Hepatic encephalopathy-  no evidence of hepatic encephalopathy -Ascites- No ascites on US  Oct 2025; had trace ascites in 2023; no history of diuretic use  --Variceal screening: scheduling today  Thrombocytopenia Most likely secondary to cirrhosis, although severity seems out of proportion to severity of portal hypertension (no decompensations, ascites, etc). - Consider hematology referral at follow up to ensure no additional causes of thrombocytopenia  Cholelithiasis with gallbladder wall thickening in the setting of chronic liver disease Gallbladder wall thickening likely due to chronic liver disease. Cholelithiasis noted on ultrasound. - No indication for cholecystectomy  Splenomegaly secondary to portal hypertension Mild splenomegaly consistent with portal hypertension from cirrhosis. Contributes to thrombocytopenia via splenic sequestration.  History of gastroduodenal ulcers, gastritis, duodenitis, and severe reflux esophagitis Previous endoscopy showed multiple ulcers and severe reflux esophagitis. Occasional omeprazole use for reflux control. - Schedule upper endoscopy to assess healing of ulcers and inflammation.  Alcohol use disorder Current consumption approximately one glass of wine per night. Alcohol  cessation critical due to cirrhosis. Discussed naltrexone side effects. - Prescribed naltrexone 50 mg daily. - Advised complete alcohol cessation.  Peripheral neuropathy Likely secondary to chronic alcohol abuse.  B12 level normal  Recording duration: 34 minutes      The details, risks (including bleeding, perforation, infection, missed lesions, medication reactions and possible hospitalization or surgery if complications occur), benefits, and alternatives to EGD with possible biopsy and possible dilation were discussed with the patient and he/she consents to proceed.   I spent a total of 55 minutes reviewing the patient's medical record, interviewing and examining the patient, discussing her diagnosis and management of her condition going forward, and documenting in the medical record  Cai Flott E. Stacia, MD [facility]   CC:  Everlene Parris LABOR, MD

## 2024-10-20 LAB — ANTI-SMOOTH MUSCLE ANTIBODY, IGG: Actin (Smooth Muscle) Antibody (IGG): 24 U — ABNORMAL HIGH (ref <20)

## 2024-10-20 LAB — CERULOPLASMIN: Ceruloplasmin: 22 mg/dL (ref 14–48)

## 2024-10-20 LAB — HEPATITIS B SURFACE ANTIGEN: Hepatitis B Surface Ag: NONREACTIVE

## 2024-10-20 LAB — HEPATITIS A ANTIBODY, TOTAL: Hepatitis A AB,Total: REACTIVE — AB

## 2024-10-20 LAB — ANA: Anti Nuclear Antibody (ANA): NEGATIVE

## 2024-10-20 LAB — IGA: Immunoglobulin A: 977 mg/dL — ABNORMAL HIGH (ref 47–310)

## 2024-10-20 LAB — AFP TUMOR MARKER: AFP-Tumor Marker: 6.8 ng/mL — ABNORMAL HIGH

## 2024-10-20 LAB — IGG: IgG (Immunoglobin G), Serum: 2292 mg/dL — ABNORMAL HIGH (ref 600–1640)

## 2024-10-20 LAB — ALPHA-1-ANTITRYPSIN: A-1 Antitrypsin, Ser: 186 mg/dL (ref 83–199)

## 2024-10-20 LAB — HEPATITIS C ANTIBODY: Hepatitis C Ab: NONREACTIVE

## 2024-10-20 LAB — TISSUE TRANSGLUTAMINASE, IGA: (tTG) Ab, IgA: <1 U/mL

## 2024-10-20 LAB — MITOCHONDRIAL ANTIBODIES: Mitochondrial M2 Ab, IgG: <=20 U (ref <=20.0)

## 2024-10-20 LAB — HEPATITIS B SURFACE ANTIBODY,QUALITATIVE: Hep B S Ab: REACTIVE — AB

## 2024-10-22 ENCOUNTER — Ambulatory Visit: Payer: Self-pay | Admitting: Gastroenterology

## 2024-10-22 ENCOUNTER — Other Ambulatory Visit: Payer: Self-pay

## 2024-10-22 DIAGNOSIS — K7030 Alcoholic cirrhosis of liver without ascites: Secondary | ICD-10-CM

## 2024-10-22 NOTE — Progress Notes (Signed)
 Andrea Franco,  The tests assessing for other causes of chronic liver disease were essentially negative/unremarkable. You have evidence of immunity to Hepatitis A and B and so do not need to be vaccinated against these viruses.  You had very slight elevation of an antibody associated with autoimmune hepatitis (anti-smooth muscle antibody).  We often see mildly elevated titers of this antibody in the setting of alcohol-induced liver disease, and it is very unlikely that you have autoimmune hepatitis. We can recheck the levels of this antibody in a few months after you have stopped drinking.  Your total IgG and IgA levels were elevated which is a finding often seen in cirrhosis. Your AFP level was very slightly elevated.  This is a tumor marker associated with liver cancer, but is often elevated in the setting of cirrhosis/excessive alcohol use. Your recent ultrasound did not show any concerning findings to suggest liver cancer.  Given your severely low platelet count, I would like to monitor this a little more closely.  Let's plan to recheck your platelets a week before your EGD which is scheduled for Dec 9th.  Please come by our lab the week of Dec 1st to recheck your platelet count  Please completely stop drinking as discussed.  Let's plan to repeat labs in 4 months.  Andrea Franco,  Please place orders for the following:  Repeat CBC in 2 weeks.  Repeat the following in 4 months: GGT Anti-smooth muscle antibody AFP  Repeat RUQ ultrasound in April 2026

## 2024-11-03 ENCOUNTER — Ambulatory Visit: Payer: Self-pay

## 2024-11-07 ENCOUNTER — Telehealth: Payer: Self-pay | Admitting: Gastroenterology

## 2024-11-07 NOTE — Telephone Encounter (Signed)
 Good afternoon Dr. Stacia,    I received a call from this patient stating that she is need to cancel her procedure set for December the 9 th due to her work schedule. Patient stated that she will be calling back next week to reschedule her procedure. Please advise.    Thank you.

## 2024-11-11 ENCOUNTER — Encounter: Payer: Self-pay | Admitting: Gastroenterology
